# Patient Record
Sex: Male | Born: 1950 | Race: White | Hispanic: No | Smoking: Former smoker
Health system: Southern US, Community
[De-identification: ages and names within clinical notes are randomized; demographics above are authoritative.]

## PROBLEM LIST (undated history)

## (undated) DIAGNOSIS — I2510 Atherosclerotic heart disease of native coronary artery without angina pectoris: Secondary | ICD-10-CM

## (undated) DIAGNOSIS — I10 Essential (primary) hypertension: Secondary | ICD-10-CM

## (undated) DIAGNOSIS — R0600 Dyspnea, unspecified: Secondary | ICD-10-CM

## (undated) DIAGNOSIS — I6529 Occlusion and stenosis of unspecified carotid artery: Secondary | ICD-10-CM

## (undated) DIAGNOSIS — S42409A Unspecified fracture of lower end of unspecified humerus, initial encounter for closed fracture: Secondary | ICD-10-CM

## (undated) DIAGNOSIS — M1990 Unspecified osteoarthritis, unspecified site: Secondary | ICD-10-CM

## (undated) DIAGNOSIS — E7800 Pure hypercholesterolemia, unspecified: Secondary | ICD-10-CM

## (undated) DIAGNOSIS — M19042 Primary osteoarthritis, left hand: Secondary | ICD-10-CM

## (undated) DIAGNOSIS — I639 Cerebral infarction, unspecified: Secondary | ICD-10-CM

## (undated) DIAGNOSIS — M24521 Contracture, right elbow: Secondary | ICD-10-CM

## (undated) DIAGNOSIS — M5136 Other intervertebral disc degeneration, lumbar region: Secondary | ICD-10-CM

## (undated) DIAGNOSIS — M87052 Idiopathic aseptic necrosis of left femur: Secondary | ICD-10-CM

## (undated) DIAGNOSIS — M19041 Primary osteoarthritis, right hand: Secondary | ICD-10-CM

## (undated) DIAGNOSIS — M87051 Idiopathic aseptic necrosis of right femur: Secondary | ICD-10-CM

## (undated) HISTORY — DX: Unspecified fracture of lower end of unspecified humerus, initial encounter for closed fracture: S42.409A

## (undated) HISTORY — PX: EYE SURGERY: SHX253

## (undated) HISTORY — DX: Idiopathic aseptic necrosis of right femur: M87.051

## (undated) HISTORY — DX: Primary osteoarthritis, left hand: M19.042

## (undated) HISTORY — DX: Contracture, right elbow: M24.521

## (undated) HISTORY — DX: Other intervertebral disc degeneration, lumbar region: M51.36

## (undated) HISTORY — DX: Idiopathic aseptic necrosis of left femur: M87.052

## (undated) HISTORY — DX: Cerebral infarction, unspecified: I63.9

## (undated) HISTORY — DX: Dyspnea, unspecified: R06.00

## (undated) HISTORY — DX: Pure hypercholesterolemia, unspecified: E78.00

## (undated) HISTORY — DX: Occlusion and stenosis of unspecified carotid artery: I65.29

## (undated) HISTORY — DX: Atherosclerotic heart disease of native coronary artery without angina pectoris: I25.10

## (undated) HISTORY — DX: Unspecified osteoarthritis, unspecified site: M19.90

## (undated) HISTORY — DX: Primary osteoarthritis, right hand: M19.041

## (undated) HISTORY — DX: Essential (primary) hypertension: I10

---

## 2001-08-13 HISTORY — PX: HERNIA REPAIR: SHX51

## 2002-09-14 ENCOUNTER — Encounter: Payer: Self-pay | Admitting: Internal Medicine

## 2002-09-14 ENCOUNTER — Ambulatory Visit (HOSPITAL_COMMUNITY): Admission: RE | Admit: 2002-09-14 | Discharge: 2002-09-14 | Payer: Self-pay | Admitting: Internal Medicine

## 2002-09-28 ENCOUNTER — Encounter: Admission: RE | Admit: 2002-09-28 | Discharge: 2002-09-28 | Payer: Self-pay

## 2002-09-29 ENCOUNTER — Ambulatory Visit (HOSPITAL_BASED_OUTPATIENT_CLINIC_OR_DEPARTMENT_OTHER): Admission: RE | Admit: 2002-09-29 | Discharge: 2002-09-29 | Payer: Self-pay

## 2005-06-16 ENCOUNTER — Emergency Department (HOSPITAL_COMMUNITY): Admission: EM | Admit: 2005-06-16 | Discharge: 2005-06-16 | Payer: Self-pay | Admitting: Emergency Medicine

## 2005-06-19 ENCOUNTER — Encounter (HOSPITAL_COMMUNITY): Admission: RE | Admit: 2005-06-19 | Discharge: 2005-09-17 | Payer: Self-pay | Admitting: Emergency Medicine

## 2005-06-23 ENCOUNTER — Emergency Department (HOSPITAL_COMMUNITY): Admission: EM | Admit: 2005-06-23 | Discharge: 2005-06-23 | Payer: Self-pay | Admitting: Emergency Medicine

## 2005-07-16 ENCOUNTER — Emergency Department (HOSPITAL_COMMUNITY): Admission: EM | Admit: 2005-07-16 | Discharge: 2005-07-16 | Payer: Self-pay | Admitting: Family Medicine

## 2007-04-01 ENCOUNTER — Emergency Department (HOSPITAL_COMMUNITY): Admission: EM | Admit: 2007-04-01 | Discharge: 2007-04-01 | Payer: Self-pay | Admitting: Emergency Medicine

## 2008-03-31 ENCOUNTER — Encounter: Admission: RE | Admit: 2008-03-31 | Discharge: 2008-03-31 | Payer: Self-pay | Admitting: Internal Medicine

## 2008-04-02 ENCOUNTER — Encounter: Admission: RE | Admit: 2008-04-02 | Discharge: 2008-04-02 | Payer: Self-pay | Admitting: Neurosurgery

## 2010-05-10 ENCOUNTER — Inpatient Hospital Stay (HOSPITAL_COMMUNITY): Admission: AD | Admit: 2010-05-10 | Discharge: 2010-05-14 | Payer: Self-pay | Admitting: Psychiatry

## 2010-05-10 ENCOUNTER — Ambulatory Visit: Payer: Self-pay | Admitting: Psychiatry

## 2010-05-10 ENCOUNTER — Emergency Department (HOSPITAL_COMMUNITY): Admission: EM | Admit: 2010-05-10 | Discharge: 2010-05-10 | Payer: Self-pay | Admitting: Emergency Medicine

## 2010-10-26 LAB — RAPID URINE DRUG SCREEN, HOSP PERFORMED
Amphetamines: NOT DETECTED
Barbiturates: NOT DETECTED
Benzodiazepines: POSITIVE — AB
Opiates: POSITIVE — AB

## 2010-10-26 LAB — CBC
MCH: 31 pg (ref 26.0–34.0)
MCHC: 33.6 g/dL (ref 30.0–36.0)
MCV: 92.3 fL (ref 78.0–100.0)
Platelets: 294 10*3/uL (ref 150–400)
RBC: 4.81 MIL/uL (ref 4.22–5.81)
RDW: 13 % (ref 11.5–15.5)

## 2010-10-26 LAB — DIFFERENTIAL
Eosinophils Absolute: 0.4 10*3/uL (ref 0.0–0.7)
Eosinophils Relative: 4 % (ref 0–5)
Lymphs Abs: 3.1 10*3/uL (ref 0.7–4.0)

## 2010-10-26 LAB — BASIC METABOLIC PANEL
BUN: 8 mg/dL (ref 6–23)
CO2: 25 mEq/L (ref 19–32)
Chloride: 103 mEq/L (ref 96–112)
Creatinine, Ser: 0.85 mg/dL (ref 0.4–1.5)
Glucose, Bld: 122 mg/dL — ABNORMAL HIGH (ref 70–99)

## 2010-10-26 LAB — HEPATIC FUNCTION PANEL
Alkaline Phosphatase: 73 U/L (ref 39–117)
Indirect Bilirubin: 0.8 mg/dL (ref 0.3–0.9)
Total Protein: 7.8 g/dL (ref 6.0–8.3)

## 2010-12-29 NOTE — Op Note (Signed)
   NAME:  Connor Butler, Connor Butler                         ACCOUNT NO.:  [id number]   MEDICAL RECORD NO.:  [id number]                   PATIENT TYPE:  AMB   LOCATION:  DSC                                  FACILITY:  [facility]   PHYSICIAN:  Lorre Munroe., M.D.            DATE OF BIRTH:  1950/08/17   DATE OF PROCEDURE:  09/29/2002  DATE OF DISCHARGE:                                 OPERATIVE REPORT   PREOPERATIVE DIAGNOSIS:  Umbilical hernia.   POSTOPERATIVE DIAGNOSIS:  Umbilical hernia.   OPERATION PERFORMED:  Repair of umbilical hernia.   SURGEON:  Lebron Conners, M.D.   ANESTHESIA:  General.   DESCRIPTION OF PROCEDURE:  After the patient was monitored and anesthetized  and had routine preparation and draping of the abdomen, I made a transverse  incision over the hernia just above the umbilicus.  It was quite large and  required an incision about 7 to 8 cm in length.  I then dissected down  through the subcutaneous tissues and encountered the hernia sac and  separated it from the umbilical skin and surrounding normal fat of the  subcutaneous tissues.  I dissected it all the way down to the hole in the  midline fascia which was about 2 cm in size.  In the dissection, I made a  couple of holes in the hernia sac.  I decided to remove it so I completely  excised the hernia sac and took down adhesions of the incarcerated omentum  to the sac.  I then enlarged the incision a bit on each side in order to  reduce the omentum and it easily went back in.  I then closed the defect  with running 0 Prolene.  I then mobilized the subcutaneous tissues in all  directions and took a piece of polypropylene mesh which was approximately 7  x 5 cm in dimension and trimmed to be oval and decided to sew that down with  running 2-0 Prolene basting stitch.  I went all the way around and tied the  suture back to itself.  That appeared to provide a very secure hernia  repair.  I then closed the subcutaneous tissues  with running 4-0 Vicryl and  sutured the umbilical skin which remained after I debrided very dusky skin  down to the central part of the repair.  I then closed the skin with  staples.  I applied a slightly compressive bandage.  The patient tolerated  the procedure well.                                                Lorre Munroe., M.D.    WB/MEDQ  D:  09/29/2002  T:  09/29/2002  Job:  [id number]   cc:   [facility]

## 2013-11-19 ENCOUNTER — Other Ambulatory Visit: Payer: Self-pay | Admitting: Family Medicine

## 2013-11-19 DIAGNOSIS — R0989 Other specified symptoms and signs involving the circulatory and respiratory systems: Secondary | ICD-10-CM

## 2013-11-20 ENCOUNTER — Other Ambulatory Visit: Payer: Self-pay | Admitting: Family Medicine

## 2013-11-24 ENCOUNTER — Ambulatory Visit
Admission: RE | Admit: 2013-11-24 | Discharge: 2013-11-24 | Disposition: A | Discharge status: Home / Self Care | Payer: Medicare Other | Source: Ambulatory Visit | Attending: Family Medicine | Admitting: Family Medicine

## 2013-11-24 DIAGNOSIS — R0989 Other specified symptoms and signs involving the circulatory and respiratory systems: Secondary | ICD-10-CM

## 2013-11-27 ENCOUNTER — Encounter: Payer: Self-pay | Admitting: Vascular Surgery

## 2013-11-30 ENCOUNTER — Encounter: Payer: Self-pay | Admitting: Vascular Surgery

## 2013-12-01 ENCOUNTER — Encounter: Payer: Self-pay | Admitting: Vascular Surgery

## 2013-12-01 ENCOUNTER — Ambulatory Visit (INDEPENDENT_AMBULATORY_CARE_PROVIDER_SITE_OTHER): Payer: Medicare Other | Admitting: Vascular Surgery

## 2013-12-01 VITALS — BP 156/70 | HR 68 | Ht 66.0 in | Wt 181.0 lb

## 2013-12-01 DIAGNOSIS — I6529 Occlusion and stenosis of unspecified carotid artery: Secondary | ICD-10-CM

## 2013-12-01 NOTE — Progress Notes (Signed)
Patient name: Connor Butler MRN: [id number] DOB: [birth year] Sex: male   Referred by: Marisue Humble  Reason for referral:  Chief Complaint  Patient presents with  . New Evaluation    left carotid stenosis    HISTORY OF PRESENT ILLNESS: Patient is a very pleasant 63 year old gentleman who was found to have bilateral carotid bruits. He subsequently underwent carotid duplex and [facility] imaging and I have these results for review. This does show occlusion of the right internal carotid artery and high-grade stenosis of the right common carotid artery. There is estimated at 50-69% stenosis in the left internal carotid artery. The patient is right-handed. He specifically denies any prior episodes of amaurosis fugax, aphasia TIA or stroke. He denies any cardiac difficulty. A long history of cigarette smoking and also heavy alcohol consumption  Past Medical History  Diagnosis Date  . Hypertension   . Carotid artery occlusion   . Arthritis     Past Surgical History  Procedure Laterality Date  . Hernia repair  2003    History   Social History  . Marital Status: Legally Separated    Spouse Name: N/A    Number of Children: N/A  . Years of Education: N/A   Occupational History  . Not on file.   Social History Main Topics  . Smoking status: Current Every Day Smoker -- 1.00 packs/day for 45 years    Types: Cigarettes  . Smokeless tobacco: Not on file  . Alcohol Use: 25.2 oz/week    42 Cans of beer per week  . Drug Use: No  . Sexual Activity: Not on file   Other Topics Concern  . Not on file   Social History Narrative  . No narrative on file    History reviewed. No pertinent family history.  Allergies as of 12/01/2013  . (No Known Allergies)    Current Outpatient Prescriptions on File Prior to Visit  Medication Sig Dispense Refill  . lisinopril (PRINIVIL,ZESTRIL) 10 MG tablet Take 10 mg by mouth daily.       No current facility-administered medications on file prior  to visit.     REVIEW OF SYSTEMS:  Positives indicated with an "X"  CARDIOVASCULAR:  [ ]  chest pain   [ ]  chest pressure   [ ]  palpitations   [ ]  orthopnea   [x ] dyspnea on exertion   [ ]  claudication   [ ]  rest pain   [ ]  DVT   [ ]  phlebitis PULMONARY:   [x ] productive cough   [ ]  asthma   [ ]  wheezing NEUROLOGIC:   [ ]  weakness  [ ]  paresthesias  [ ]  aphasia  [ ]  amaurosis  [xx ] dizziness HEMATOLOGIC:   [ ]  bleeding problems   [ ]  clotting disorders MUSCULOSKELETAL:  [ ]  joint pain   [ ]  joint swelling GASTROINTESTINAL: [ ]   blood in stool  [ ]   hematemesis GENITOURINARY:  [ ]   dysuria  [ ]   hematuria PSYCHIATRIC:  [ ]  history of major depression INTEGUMENTARY:  [ ]  rashes  [ ]  ulcers CONSTITUTIONAL:  [ ]  fever   [ ]  chills  PHYSICAL EXAMINATION:  General: The patient is a well-nourished male, in no acute distress. Vital signs are BP 156/70  Pulse 68  Ht 5\' 6"  (1.676 m)  Wt 181 lb (82.101 kg)  BMI 29.23 kg/m2  SpO2 100% Pulmonary: There is a good air exchange bilaterally without wheezing or rales. Abdomen: Soft and non-tender  with normal pitch bowel sounds. Musculoskeletal: There are no major deformities.  There is no significant extremity pain. Neurologic: No focal weakness or paresthesias are detected, Skin: There are no ulcer or rashes noted. Psychiatric: The patient has normal affect. Cardiovascular: There is a regular rate and rhythm without significant murmur appreciated.    Vascular Lab Studies:  Reviewed from [facility] imaging with findings as above  Impression and Plan:  Asymptomatic right internal carotid artery occlusion and moderate to severe left internal carotid artery stenosis. Had a long discussion with the patient explaining the significance of this. I explained symptoms of carotid disease and he will notify us immediate should this occur. Otherwise we'll see him again at 6 month intervals with carotid duplex evaluation. I did discuss the critical  importance of smoking cessation and this was directly related to his severe extracranial cerebrovascular occlusive disease. He will see [place] in 6 months    Arvilla Meres [facility] Office: [phone]

## 2013-12-01 NOTE — Addendum Note (Signed)
Addended by: Dorthula Rue L on: 12/01/2013 11:10 AM   Modules accepted: Orders

## 2014-06-01 ENCOUNTER — Ambulatory Visit: Payer: Medicare Other | Admitting: Vascular Surgery

## 2014-06-01 ENCOUNTER — Other Ambulatory Visit (HOSPITAL_COMMUNITY): Payer: Medicare Other

## 2014-06-22 ENCOUNTER — Ambulatory Visit: Payer: Medicare Other | Admitting: Vascular Surgery

## 2014-06-22 ENCOUNTER — Other Ambulatory Visit (HOSPITAL_COMMUNITY): Payer: Medicare Other

## 2014-07-16 ENCOUNTER — Ambulatory Visit (HOSPITAL_COMMUNITY)
Admission: RE | Admit: 2014-07-16 | Discharge: 2014-07-16 | Disposition: A | Discharge status: Home / Self Care | Payer: Medicare Other | Source: Ambulatory Visit | Attending: Vascular Surgery | Admitting: Vascular Surgery

## 2014-07-16 DIAGNOSIS — I6523 Occlusion and stenosis of bilateral carotid arteries: Secondary | ICD-10-CM

## 2014-07-19 ENCOUNTER — Encounter: Payer: Self-pay | Admitting: Vascular Surgery

## 2014-07-20 ENCOUNTER — Encounter: Payer: Self-pay | Admitting: Vascular Surgery

## 2014-07-20 ENCOUNTER — Other Ambulatory Visit: Payer: Self-pay | Admitting: Vascular Surgery

## 2014-07-20 ENCOUNTER — Ambulatory Visit (INDEPENDENT_AMBULATORY_CARE_PROVIDER_SITE_OTHER): Payer: Medicare Other | Admitting: Vascular Surgery

## 2014-07-20 VITALS — BP 135/83 | HR 76 | Resp 16 | Ht 66.0 in | Wt 181.0 lb

## 2014-07-20 DIAGNOSIS — R0989 Other specified symptoms and signs involving the circulatory and respiratory systems: Secondary | ICD-10-CM

## 2014-07-20 DIAGNOSIS — Z01810 Encounter for preprocedural cardiovascular examination: Secondary | ICD-10-CM

## 2014-07-20 DIAGNOSIS — I6523 Occlusion and stenosis of bilateral carotid arteries: Secondary | ICD-10-CM

## 2014-07-20 LAB — CREATININE, SERUM: CREATININE: 1.3 mg/dL (ref 0.50–1.35)

## 2014-07-20 LAB — BUN: BUN: 13 mg/dL (ref 6–23)

## 2014-07-20 NOTE — Progress Notes (Signed)
Here today for continued follow-up of his extracranial sure vascular occlusive disease. I had seen him 6 months ago. At that time he had a carotid duplex for an outlying lab to evaluate carotid bruits. This revealed apparent occlusion of his right internal carotid artery with a common carotid artery stenosis on the right. There was moderate to severe left carotid stenosis. He was completely asymptomatic. He is seen today for 6 month follow-up. Fortunately continues to remain asymptomatic with no history of amaurosis fugax, transient ischemic attack or stroke.  Past Medical History  Diagnosis Date  . Hypertension   . Carotid artery occlusion   . Arthritis     History  Substance Use Topics  . Smoking status: Current Every Day Smoker -- 1.00 packs/day for 45 years    Types: Cigarettes  . Smokeless tobacco: Never Used  . Alcohol Use: 25.2 oz/week    42 Cans of beer per week    History reviewed. No pertinent family history.  No Known Allergies  Current outpatient prescriptions: lisinopril (PRINIVIL,ZESTRIL) 10 MG tablet, Take 10 mg by mouth daily., Disp: , Rfl:   BP 135/83 mmHg  Pulse 76  Resp 16  Ht 5\' 6"  (1.676 m)  Wt 181 lb (82.101 kg)  BMI 29.23 kg/m2  Body mass index is 29.23 kg/(m^2).       Physical exam he is a well-developed gentleman in no acute distress neurologically intact Carotid arteries reveal harsh carotid bruits bilaterally 2+ radial pulses bilaterally No focal neurologic deficits Respirations are equal nonlabored  He did undergo repeat duplex in our office today. This doesn't did suggest string sign with some flow into the right internal carotid artery on duplex. Did show 60-70% left internal carotid artery stenosis  Impression and plan extracranial cerebrovascular occlusive disease. Prior study had suggested right internal artery occlusion whereas today's study suggesting this may be high-grade stenosis. I think this is somewhat difficult to determine on  duplex. I have recommended CT angiogram for further evaluation. I explained that this did show a high-grade stenosis with a normal internal carotid artery above we would recommend endarterectomy. We will discuss this with him following CT angiogram for further evaluation of his carotid stenosis

## 2014-07-20 NOTE — Addendum Note (Signed)
Addended by: Mena Goes on: 07/20/2014 04:47 PM   Modules accepted: Orders

## 2014-07-21 NOTE — Addendum Note (Signed)
Addended by: Mena Goes on: 07/21/2014 01:39 PM   Modules accepted: Orders

## 2014-07-22 ENCOUNTER — Ambulatory Visit
Admission: RE | Admit: 2014-07-22 | Discharge: 2014-07-22 | Disposition: A | Discharge status: Home / Self Care | Payer: Medicare Other | Source: Ambulatory Visit | Attending: Vascular Surgery | Admitting: Vascular Surgery

## 2014-07-22 DIAGNOSIS — R0989 Other specified symptoms and signs involving the circulatory and respiratory systems: Secondary | ICD-10-CM

## 2014-07-22 DIAGNOSIS — Z01810 Encounter for preprocedural cardiovascular examination: Secondary | ICD-10-CM

## 2014-07-22 DIAGNOSIS — I6523 Occlusion and stenosis of bilateral carotid arteries: Secondary | ICD-10-CM

## 2014-07-22 MED ORDER — IOHEXOL 350 MG/ML SOLN
65.0000 mL | Freq: Once | INTRAVENOUS | Status: AC | PRN
  Administered 2014-07-22: 65 mL via INTRAVENOUS

## 2014-08-03 ENCOUNTER — Ambulatory Visit: Payer: Medicare Other | Admitting: Vascular Surgery

## 2014-08-04 ENCOUNTER — Telehealth: Payer: Self-pay | Admitting: Vascular Surgery

## 2014-08-04 NOTE — Telephone Encounter (Signed)
-----   Message from Sherrye Payor, RN sent at 08/03/2014  5:15 PM EST ----- Regarding: RE: needs 1 yr. f/u Contact: [phone] I rechecked with Dr. Donnetta Hutching.  He felt it would be okay to do this at 1 yr., since his left ICA Stenosis is at 55 %.  Thank you!  ----- Message -----    From: Gena Fray    Sent: 08/03/2014   3:27 PM      To: Lynetta Mare Pullins, RN Subject: RE: needs 1 yr. f/u                            Just to clarify- I need to cancel his appointment in June 2016 for carotid ultrasound and OV and reschedule out to 07/2015. Is that correct?  Hinton Dyer ----- Message -----    From: Lynetta Mare Pullins, RN    Sent: 08/03/2014   3:08 PM      To: [facility] Rose Admin Pool Subject: needs 1 yr. f/u                                Per recommendation of Dr. Donnetta Hutching, this pt. needs f/u in one year for bilateral carotid ultrasound and OV w/ Dr. Donnetta Hutching; recent CTA of neck  showed near complete occlusion of right ICA, and 55% prox. stenosis of Left ICA; pt. was notified by phone. I told him he would be notified of one year appt.

## 2014-10-12 DIAGNOSIS — E780 Pure hypercholesterolemia: Secondary | ICD-10-CM | POA: Diagnosis not present

## 2014-12-31 DIAGNOSIS — I10 Essential (primary) hypertension: Secondary | ICD-10-CM | POA: Diagnosis not present

## 2014-12-31 DIAGNOSIS — I779 Disorder of arteries and arterioles, unspecified: Secondary | ICD-10-CM | POA: Diagnosis not present

## 2014-12-31 DIAGNOSIS — H539 Unspecified visual disturbance: Secondary | ICD-10-CM | POA: Diagnosis not present

## 2014-12-31 DIAGNOSIS — Z720 Tobacco use: Secondary | ICD-10-CM | POA: Diagnosis not present

## 2014-12-31 DIAGNOSIS — E780 Pure hypercholesterolemia: Secondary | ICD-10-CM | POA: Diagnosis not present

## 2014-12-31 DIAGNOSIS — M791 Myalgia: Secondary | ICD-10-CM | POA: Diagnosis not present

## 2015-01-25 ENCOUNTER — Other Ambulatory Visit (HOSPITAL_COMMUNITY): Payer: Medicare Other

## 2015-01-25 ENCOUNTER — Ambulatory Visit: Payer: Medicare Other | Admitting: Vascular Surgery

## 2015-02-02 DIAGNOSIS — H02834 Dermatochalasis of left upper eyelid: Secondary | ICD-10-CM | POA: Diagnosis not present

## 2015-02-02 DIAGNOSIS — R6889 Other general symptoms and signs: Secondary | ICD-10-CM | POA: Diagnosis not present

## 2015-02-02 DIAGNOSIS — H02831 Dermatochalasis of right upper eyelid: Secondary | ICD-10-CM | POA: Diagnosis not present

## 2015-02-02 DIAGNOSIS — H25813 Combined forms of age-related cataract, bilateral: Secondary | ICD-10-CM | POA: Diagnosis not present

## 2015-02-24 DIAGNOSIS — H268 Other specified cataract: Secondary | ICD-10-CM | POA: Diagnosis not present

## 2015-02-24 DIAGNOSIS — R6889 Other general symptoms and signs: Secondary | ICD-10-CM | POA: Diagnosis not present

## 2015-02-24 DIAGNOSIS — H5703 Miosis: Secondary | ICD-10-CM | POA: Diagnosis not present

## 2015-02-24 DIAGNOSIS — H2511 Age-related nuclear cataract, right eye: Secondary | ICD-10-CM | POA: Diagnosis not present

## 2015-03-21 DIAGNOSIS — H2512 Age-related nuclear cataract, left eye: Secondary | ICD-10-CM | POA: Diagnosis not present

## 2015-03-24 DIAGNOSIS — H2512 Age-related nuclear cataract, left eye: Secondary | ICD-10-CM | POA: Diagnosis not present

## 2015-06-01 ENCOUNTER — Inpatient Hospital Stay (HOSPITAL_COMMUNITY)
Admission: EM | Admit: 2015-06-01 | Discharge: 2015-06-03 | DRG: 065 | Disposition: A | Discharge status: Home / Self Care | Payer: Medicare HMO | Attending: Internal Medicine | Admitting: Internal Medicine

## 2015-06-01 ENCOUNTER — Encounter (HOSPITAL_COMMUNITY): Payer: Self-pay | Admitting: Emergency Medicine

## 2015-06-01 ENCOUNTER — Emergency Department (HOSPITAL_COMMUNITY): Payer: Medicare HMO

## 2015-06-01 ENCOUNTER — Inpatient Hospital Stay (HOSPITAL_COMMUNITY): Payer: Medicare HMO

## 2015-06-01 DIAGNOSIS — I6523 Occlusion and stenosis of bilateral carotid arteries: Secondary | ICD-10-CM | POA: Diagnosis not present

## 2015-06-01 DIAGNOSIS — R51 Headache: Secondary | ICD-10-CM | POA: Diagnosis not present

## 2015-06-01 DIAGNOSIS — E871 Hypo-osmolality and hyponatremia: Secondary | ICD-10-CM | POA: Diagnosis not present

## 2015-06-01 DIAGNOSIS — F17210 Nicotine dependence, cigarettes, uncomplicated: Secondary | ICD-10-CM | POA: Diagnosis present

## 2015-06-01 DIAGNOSIS — M1990 Unspecified osteoarthritis, unspecified site: Secondary | ICD-10-CM | POA: Diagnosis not present

## 2015-06-01 DIAGNOSIS — F1029 Alcohol dependence with unspecified alcohol-induced disorder: Secondary | ICD-10-CM | POA: Diagnosis not present

## 2015-06-01 DIAGNOSIS — I6522 Occlusion and stenosis of left carotid artery: Secondary | ICD-10-CM | POA: Diagnosis not present

## 2015-06-01 DIAGNOSIS — F1210 Cannabis abuse, uncomplicated: Secondary | ICD-10-CM | POA: Diagnosis not present

## 2015-06-01 DIAGNOSIS — I639 Cerebral infarction, unspecified: Principal | ICD-10-CM

## 2015-06-01 DIAGNOSIS — I6789 Other cerebrovascular disease: Secondary | ICD-10-CM | POA: Diagnosis not present

## 2015-06-01 DIAGNOSIS — R42 Dizziness and giddiness: Secondary | ICD-10-CM | POA: Diagnosis not present

## 2015-06-01 DIAGNOSIS — J449 Chronic obstructive pulmonary disease, unspecified: Secondary | ICD-10-CM | POA: Diagnosis not present

## 2015-06-01 DIAGNOSIS — J019 Acute sinusitis, unspecified: Secondary | ICD-10-CM | POA: Diagnosis present

## 2015-06-01 DIAGNOSIS — Z720 Tobacco use: Secondary | ICD-10-CM

## 2015-06-01 DIAGNOSIS — I6503 Occlusion and stenosis of bilateral vertebral arteries: Secondary | ICD-10-CM | POA: Diagnosis present

## 2015-06-01 DIAGNOSIS — I10 Essential (primary) hypertension: Secondary | ICD-10-CM | POA: Diagnosis present

## 2015-06-01 DIAGNOSIS — F1020 Alcohol dependence, uncomplicated: Secondary | ICD-10-CM | POA: Diagnosis not present

## 2015-06-01 DIAGNOSIS — R531 Weakness: Secondary | ICD-10-CM | POA: Diagnosis not present

## 2015-06-01 DIAGNOSIS — R200 Anesthesia of skin: Secondary | ICD-10-CM | POA: Diagnosis not present

## 2015-06-01 DIAGNOSIS — Z7982 Long term (current) use of aspirin: Secondary | ICD-10-CM | POA: Diagnosis not present

## 2015-06-01 LAB — CBC
HCT: 39.4 % (ref 39.0–52.0)
HEMATOCRIT: 36.5 % — AB (ref 39.0–52.0)
Hemoglobin: 13.3 g/dL (ref 13.0–17.0)
Hemoglobin: 12 g/dL — ABNORMAL LOW (ref 13.0–17.0)
MCH: 30.7 pg (ref 26.0–34.0)
MCH: 30.1 pg (ref 26.0–34.0)
MCHC: 33.8 g/dL (ref 30.0–36.0)
MCHC: 32.9 g/dL (ref 30.0–36.0)
MCV: 91 fL (ref 78.0–100.0)
MCV: 91.5 fL (ref 78.0–100.0)
PLATELETS: 316 10*3/uL (ref 150–400)
Platelets: 284 10*3/uL (ref 150–400)
RBC: 4.33 MIL/uL (ref 4.22–5.81)
RBC: 3.99 MIL/uL — AB (ref 4.22–5.81)
RDW: 12.5 % (ref 11.5–15.5)
RDW: 12.6 % (ref 11.5–15.5)
WBC: 8.3 10*3/uL (ref 4.0–10.5)
WBC: 7.4 10*3/uL (ref 4.0–10.5)

## 2015-06-01 LAB — RAPID URINE DRUG SCREEN, HOSP PERFORMED
Amphetamines: NOT DETECTED
BARBITURATES: NOT DETECTED
Benzodiazepines: NOT DETECTED
Cocaine: NOT DETECTED
OPIATES: NOT DETECTED
TETRAHYDROCANNABINOL: POSITIVE — AB

## 2015-06-01 LAB — URINALYSIS, ROUTINE W REFLEX MICROSCOPIC
BILIRUBIN URINE: NEGATIVE
Glucose, UA: NEGATIVE mg/dL
HGB URINE DIPSTICK: NEGATIVE
KETONES UR: NEGATIVE mg/dL
Leukocytes, UA: NEGATIVE
Nitrite: NEGATIVE
Protein, ur: NEGATIVE mg/dL
SPECIFIC GRAVITY, URINE: 1.008 (ref 1.005–1.030)
UROBILINOGEN UA: 0.2 mg/dL (ref 0.0–1.0)
pH: 5.5 (ref 5.0–8.0)

## 2015-06-01 LAB — PHOSPHORUS: PHOSPHORUS: 2.6 mg/dL (ref 2.5–4.6)

## 2015-06-01 LAB — PROTIME-INR
INR: 1 (ref 0.00–1.49)
PROTHROMBIN TIME: 13.4 s (ref 11.6–15.2)

## 2015-06-01 LAB — ETHANOL

## 2015-06-01 LAB — COMPREHENSIVE METABOLIC PANEL
ALBUMIN: 3.8 g/dL (ref 3.5–5.0)
ALT: 17 U/L (ref 17–63)
ANION GAP: 10 (ref 5–15)
AST: 23 U/L (ref 15–41)
Alkaline Phosphatase: 54 U/L (ref 38–126)
BILIRUBIN TOTAL: 0.5 mg/dL (ref 0.3–1.2)
BUN: 9 mg/dL (ref 6–20)
CALCIUM: 9.3 mg/dL (ref 8.9–10.3)
CHLORIDE: 98 mmol/L — AB (ref 101–111)
CO2: 23 mmol/L (ref 22–32)
CREATININE: 1.1 mg/dL (ref 0.61–1.24)
GFR calc Af Amer: >60 mL/min (ref >60)
GFR calc non Af Amer: >60 mL/min (ref >60)
GLUCOSE: 108 mg/dL — AB (ref 65–99)
POTASSIUM: 4.1 mmol/L (ref 3.5–5.1)
SODIUM: 131 mmol/L — AB (ref 135–145)
TOTAL PROTEIN: 7.1 g/dL (ref 6.5–8.1)

## 2015-06-01 LAB — DIFFERENTIAL
BASOS ABS: 0.1 10*3/uL (ref 0.0–0.1)
BASOS PCT: 1 %
EOS ABS: 0.2 10*3/uL (ref 0.0–0.7)
EOS PCT: 3 %
Lymphocytes Relative: 22 %
Lymphs Abs: 1.9 10*3/uL (ref 0.7–4.0)
Monocytes Absolute: 1.2 10*3/uL — ABNORMAL HIGH (ref 0.1–1.0)
Monocytes Relative: 15 %
NEUTROS PCT: 59 %
Neutro Abs: 4.9 10*3/uL (ref 1.7–7.7)

## 2015-06-01 LAB — APTT: aPTT: 28 seconds (ref 24–37)

## 2015-06-01 LAB — CREATININE, SERUM: Creatinine, Ser: 1.06 mg/dL (ref 0.61–1.24)

## 2015-06-01 LAB — MAGNESIUM: Magnesium: 2 mg/dL (ref 1.7–2.4)

## 2015-06-01 LAB — I-STAT TROPONIN, ED: Troponin i, poc: 0 ng/mL (ref 0.00–0.08)

## 2015-06-01 LAB — TROPONIN I

## 2015-06-01 MED ORDER — LORAZEPAM 1 MG PO TABS
0.0000 mg | ORAL_TABLET | Freq: Four times a day (QID) | ORAL | Status: AC
Start: 2015-06-01 — End: 2015-06-03

## 2015-06-01 MED ORDER — VITAMIN B-1 100 MG PO TABS
100.0000 mg | ORAL_TABLET | Freq: Every day | ORAL | Status: DC
  Administered 2015-06-01 – 2015-06-03 (×3): 100 mg via ORAL
  Filled 2015-06-01 (×3): qty 1

## 2015-06-01 MED ORDER — SODIUM CHLORIDE 0.9 % IJ SOLN
3.0000 mL | Freq: Two times a day (BID) | INTRAMUSCULAR | Status: DC
  Administered 2015-06-01 – 2015-06-02 (×3): 3 mL via INTRAVENOUS

## 2015-06-01 MED ORDER — MAGNESIUM SULFATE 2 GM/50ML IV SOLN
2.0000 g | Freq: Once | INTRAVENOUS | Status: AC
  Administered 2015-06-01: 2 g via INTRAVENOUS
  Filled 2015-06-01 (×2): qty 50

## 2015-06-01 MED ORDER — LISINOPRIL 10 MG PO TABS
10.0000 mg | ORAL_TABLET | Freq: Every day | ORAL | Status: DC
  Filled 2015-06-01: qty 1

## 2015-06-01 MED ORDER — STROKE: EARLY STAGES OF RECOVERY BOOK
Freq: Once | Status: AC
  Administered 2015-06-01: 22:00:00

## 2015-06-01 MED ORDER — SENNOSIDES-DOCUSATE SODIUM 8.6-50 MG PO TABS: 1.0000 | ORAL_TABLET | Freq: Every evening | ORAL | Status: DC | PRN

## 2015-06-01 MED ORDER — THIAMINE HCL 100 MG/ML IJ SOLN: 100.0000 mg | Freq: Every day | INTRAMUSCULAR | Status: DC

## 2015-06-01 MED ORDER — NICOTINE 21 MG/24HR TD PT24
21.0000 mg | MEDICATED_PATCH | Freq: Every day | TRANSDERMAL | Status: DC
  Administered 2015-06-01 – 2015-06-03 (×3): 21 mg via TRANSDERMAL
  Filled 2015-06-01 (×3): qty 1

## 2015-06-01 MED ORDER — ENOXAPARIN SODIUM 40 MG/0.4ML ~~LOC~~ SOLN
40.0000 mg | SUBCUTANEOUS | Status: DC
  Administered 2015-06-01 – 2015-06-02 (×2): 40 mg via SUBCUTANEOUS
  Filled 2015-06-01 (×2): qty 0.4

## 2015-06-01 MED ORDER — LORAZEPAM 2 MG/ML IJ SOLN
0.0000 mg | Freq: Two times a day (BID) | INTRAMUSCULAR | Status: AC
  Filled 2015-06-01: qty 1

## 2015-06-01 MED ORDER — ASPIRIN 325 MG PO TABS
325.0000 mg | ORAL_TABLET | Freq: Once | ORAL | Status: AC
Start: 2015-06-01 — End: 2015-06-01
  Administered 2015-06-01: 325 mg via ORAL
  Filled 2015-06-01: qty 1

## 2015-06-01 MED ORDER — GADOBENATE DIMEGLUMINE 529 MG/ML IV SOLN
17.0000 mL | Freq: Once | INTRAVENOUS | Status: AC | PRN
  Administered 2015-06-01: 17 mL via INTRAVENOUS

## 2015-06-01 MED ORDER — LORAZEPAM 1 MG PO TABS
0.0000 mg | ORAL_TABLET | Freq: Two times a day (BID) | ORAL | Status: DC
Start: 2015-06-01 — End: 2015-06-03

## 2015-06-01 MED ORDER — LORAZEPAM 2 MG/ML IJ SOLN
1.0000 mg | Freq: Once | INTRAMUSCULAR | Status: AC
  Administered 2015-06-01: 1 mg via INTRAVENOUS

## 2015-06-01 MED ORDER — LORAZEPAM 2 MG/ML IJ SOLN: 0.0000 mg | Freq: Four times a day (QID) | INTRAMUSCULAR | Status: AC

## 2015-06-01 MED ORDER — ASPIRIN EC 81 MG PO TBEC
81.0000 mg | DELAYED_RELEASE_TABLET | Freq: Every day | ORAL | Status: DC
  Administered 2015-06-02 – 2015-06-03 (×2): 81 mg via ORAL
  Filled 2015-06-01 (×2): qty 1

## 2015-06-01 NOTE — ED Provider Notes (Signed)
CSN: [id number]     Arrival date & time 06/01/15  1208 History   First MD Initiated Contact with Patient 06/01/15 1214     Chief Complaint  Patient presents with  . Weakness     (Consider location/radiation/quality/duration/timing/severity/associated sxs/prior Treatment) Patient is a 64 y.o. Butler presenting with weakness and extremity weakness.  Weakness Associated symptoms include headaches. Pertinent negatives include no chest pain, no abdominal pain and no shortness of breath.  Extremity Weakness This is a new problem. The current episode started more than 2 days ago. The problem occurs constantly. The problem has been gradually worsening. Associated symptoms include headaches. Pertinent negatives include no chest pain, no abdominal pain and no shortness of breath. Nothing aggravates the symptoms. Nothing relieves the symptoms. He has tried nothing for the symptoms.    Past Medical History  Diagnosis Date  . Hypertension   . Carotid artery occlusion   . Arthritis    Past Surgical History  Procedure Laterality Date  . Hernia repair  2003   History reviewed. No pertinent family history. Social History  Substance Use Topics  . Smoking status: Current Every Day Smoker -- 1.00 packs/day for 45 years    Types: Cigarettes  . Smokeless tobacco: Never Used  . Alcohol Use: 25.2 oz/week    42 Cans of beer per week    Review of Systems  Respiratory: Negative for cough and shortness of breath.   Cardiovascular: Negative for chest pain.  Gastrointestinal: Negative for abdominal pain.  Musculoskeletal: Positive for extremity weakness.  Neurological: Positive for weakness and headaches.  All other systems reviewed and are negative.     Allergies  Review of patient's allergies indicates no known allergies.  Home Medications   Prior to Admission medications   Medication Sig Start Date End Date Taking? Authorizing Provider  lisinopril (PRINIVIL,ZESTRIL) 10 MG tablet Take 10 mg  by mouth daily.    Historical Provider, MD   BP 169/72 mmHg  Pulse 88  Resp 16  Ht 5\' 9"  (1.753 m)  Wt 181 lb (82.101 kg)  BMI 26.72 kg/m2  SpO2 99% Physical Exam  Constitutional: He is oriented to person, place, and time. He appears well-developed and well-nourished.  HENT:  Head: Normocephalic and atraumatic.  Eyes: Conjunctivae and EOM are normal.  Neck: Normal range of motion. Neck supple. Carotid bruit is present (bilateral).  Cardiovascular: Normal rate and regular rhythm.   Pulmonary/Chest: Effort normal. No respiratory distress.  Abdominal: Soft. There is no tenderness.  Musculoskeletal: Normal range of motion. He exhibits no edema or tenderness.  Neurological: He is alert and oriented to person, place, and time.  No altered mental status, able to give full seemingly accurate history.  Face is symmetric, EOM's intact, pupils equal and reactive, vision intact, tongue and uvula midline without deviation Upper and Lower extremity motor 5/5, slightly decreased grip strength on the right, intact pain perception in distal extremities, 2+ reflexes in biceps, patella and achilles tendons. Finger to nose normal, heel to shin normal.   Skin: Skin is warm and dry.  Nursing note and vitals reviewed.   ED Course  Procedures (including critical care time) Labs Review Labs Reviewed - No data to display  Imaging Review No results found. I have personally reviewed and evaluated these images and lab results as part of my medical decision-making.   EKG Interpretation None      MDM   Final diagnoses:  None   1 week of worsening right arm and hand weakness  to the point where he can prolong the coffee cup. Slight headache during that time and other symptoms. No history on exam right grip strength but otherwise neuro intact as documented above. CT with subacute versus chronic infarcts. Discussed case with neurology neurology would like to get a MRI and if there is an acute stroke then  admission if no acute stroke then patient will be discharged and follow up as an outpatient.  Care to Dr. Regenia Skeeter at approxiamtely 1700, please see his note for final disposition and management.    Merrily Pew, MD 06/02/15 [phone]

## 2015-06-01 NOTE — ED Notes (Signed)
Report attempted x2

## 2015-06-01 NOTE — H&P (Signed)
[facility] History and Physical  JERIS ROSER SAY:[id number] DOB: February 09, 1951 DOA: 06/01/2015  Referring physician: Sherwood Gambler, M.D. PCP: Simona Huh, MD   Chief Complaint: Right hand weakness.  HPI: Connor Butler is a 64 y.o. male with a past medical history of alcohol abuse, 2 pack per day cigarette smoker, hypertension, bilateral carotid artery disease (per patient right side is occluded and left side is nearly occluded) who is coming with a week history of right sided extremities weakness. Per patient, he noticed about a week ago that he was having some difficulty with his handwriting and handling things. This continued to happen in a very slow wave, but this morning the patient had significant difficulty picking up a couple coffee and also noticed that his right lower extremity was clumsy. He has had a mild headache, but denies dizziness, vision changes, dysphagia, speech or language abnormalities.  Workup in the ER showed a negative CT scan of the brain, but follow-up MRI showed multiple areas of acute and subacute infarctions in both cerebral hemispheres cortically and so cortically in the watershed pattern, mostly on the right. When seen in the ER the patient was in no acute distress.   Review of Systems:  Constitutional:  Positive fatigue. No weight loss, night sweats, Fevers, chills,   HEENT:  Positive mild headache  Denies difficulty swallowing,Tooth/dental problems,Sore throat,  No sneezing, itching, ear ache, nasal congestion, post nasal drip,  Cardio-vascular:  No chest pain, Orthopnea, PND, swelling in lower extremities, anasarca, dizziness, palpitations  GI:  No heartburn, indigestion, abdominal pain, nausea, vomiting, diarrhea, change in bowel habits, loss of appetite  Resp:  Frequent shortness of breath with exertion or at rest.  Frequent productive cough, denies hemoptysis. No chest wall deformity  Skin:  no rash or lesions.  GU:  no dysuria,  change in color of urine, no urgency or frequency. No flank pain.  Musculoskeletal:  No joint pain or swelling. No decreased range of motion. No back pain.  Psych:  No change in mood or affect. No depression or anxiety. No memory loss.  Neuro : As above mentioned.  Past Medical History  Diagnosis Date  . Hypertension   . Carotid artery occlusion   . Arthritis    Past Surgical History  Procedure Laterality Date  . Hernia repair  2003   Social History:  reports that he has been smoking Cigarettes.  He has a 45 pack-year smoking history. He has never used smokeless tobacco. He reports that he drinks about 25.2 oz of alcohol per week. He reports that he does not use illicit drugs.  No Known Allergies  History reviewed.  Father had CVA.   Prior to Admission medications   Medication Sig Start Date End Date Taking? Authorizing Provider  lisinopril (PRINIVIL,ZESTRIL) 10 MG tablet Take 10 mg by mouth daily.   Yes Historical Provider, MD  Naproxen Sodium (ALEVE) 220 MG CAPS Take 440 mg by mouth daily as needed (general pain).   Yes Historical Provider, MD   Physical Exam: Filed Vitals:   06/01/15 1755 06/01/15 1840 06/01/15 1845 06/01/15 1900  BP:  157/84 153/83 154/93  Pulse: 78 74 72 75  TempSrc:      Resp: 18  19 14   Height:      Weight:      SpO2: 100%  100% 99%    Wt Readings from Last 3 Encounters:  06/01/15 82.101 kg (181 lb)  07/20/14 82.101 kg (181 lb)  12/01/13 82.101 kg (181  lb)    General:  Appears calm and comfortable Eyes: PERRL, normal lids, irises & conjunctiva ENT: grossly normal hearing, lips & tongue Neck: no LAD, masses or thyromegaly Cardiovascular: RRR, no m/r/g. No LE edema. Telemetry: SR, no arrhythmias  Respiratory: CTA bilaterally, no w/r/r. Normal respiratory effort. Abdomen: soft, ntnd Skin: no rash or induration seen on limited exam Musculoskeletal: grossly normal tone BUE/BLE Psychiatric: grossly normal mood and affect, speech fluent and  appropriate Neurologic:  awake alert oriented 3, mild right pronator drift, no cranial nerves, sensory deficits on gross neurological exam. Coordination seems to be intact.           Labs on Admission:  Basic Metabolic Panel:  Recent Labs Lab 06/01/15 1243  NA 131*  K 4.1  CL 98*  CO2 23  GLUCOSE 108*  BUN 9  CREATININE 1.10  CALCIUM 9.3   Liver Function Tests:  Recent Labs Lab 06/01/15 1243  AST 23  ALT 17  ALKPHOS 54  BILITOT 0.5  PROT 7.1  ALBUMIN 3.8   CBC:  Recent Labs Lab 06/01/15 1243  WBC 8.3  NEUTROABS 4.9  HGB 13.3  HCT 39.4  MCV 91.0  PLT 316    Radiological Exams on Admission: Ct Head Wo Contrast  06/01/2015  CLINICAL DATA:  Right arm weakness for 1 week. Decreased grip strength. Dizziness. Possible stroke. EXAM: CT HEAD WITHOUT CONTRAST TECHNIQUE: Contiguous axial images were obtained from the base of the skull through the vertex without intravenous contrast. COMPARISON:  None. FINDINGS: There is no evidence of acute large territory infarct, intracranial hemorrhage, mass, midline shift, or extra-axial fluid collection. Mild generalized cerebral atrophy is within normal limits for age. There is a subcentimeter subacute to chronic cortical infarct in the right precentral gyrus. There is a subcentimeter subacute to chronic subcortical white matter infarct in the high left frontal lobe. Periventricular white-matter hypodensities are nonspecific but compatible with mild chronic small vessel ischemic disease. Prior bilateral cataract extraction is noted. There is evidence of chronic left maxillary sinusitis, currently with minimal mucosal thickening in a small amount of secretions present. Trace fluid is present in the right sphenoid sinus, and there is moderate left frontal sinus and mild bilateral ethmoid air cell mucosal thickening. Mastoid air cells are clear. Calcified atherosclerosis is noted at the skull base. IMPRESSION: 1. No evidence of large  territory infarct or intracranial hemorrhage. No mass effect. 2. Mild chronic small vessel ischemic disease in the cerebral white matter. 3. Subcentimeter, subacute to chronic infarcts in the high frontal lobes. Electronically Signed   By: Logan Bores M.D.   On: 06/01/2015 14:20   Mr Jodene Nam Head Wo Contrast  06/01/2015  CLINICAL DATA:  Progressive weakness in the right arm over the last week. Unable but PEG up coffee this morning. Antrum and headache. Hypertension. Carotid bruits. EXAM: MRI HEAD WITHOUT AND WITH CONTRAST MRA HEAD WITHOUT CONTRAST MRA NECK WITHOUT AND WITH CONTRAST TECHNIQUE: Multiplanar, multiecho pulse sequences of the brain and surrounding structures were obtained without and with intravenous contrast. Angiographic images of the Circle of Willis were obtained using MRA technique without intravenous contrast. Angiographic images of the neck were obtained using MRA technique without and with intravenous contrast. Carotid stenosis measurements (when applicable) are obtained utilizing NASCET criteria, using the distal internal carotid diameter as the denominator. CONTRAST:  32mL MULTIHANCE GADOBENATE DIMEGLUMINE 529 MG/ML IV SOLN COMPARISON:  CT head from the same day. FINDINGS: MRI HEAD FINDINGS The diffusion-weighted images confirm scattered foci of acute/ subacute  infarct and both cerebral hemispheres. These cross a watershed distribution and a more prominent left than right. Anteriorly and superiorly the infarcts are similar on both sides. The areas of infarction extend more posteriorly on the left extending to the border of the left temporal and occipital lobes. Cortical and subcortical T2 hyperintensities correspond with the areas of restricted diffusion. Mild atrophy and periventricular white matter changes are noted bilaterally as well. T2 changes extend along the cortical spinal tract on the right, likely related to remote ischemic injury. T2 changes extend into the brainstem. There is no  flow in the right internal carotid artery at the skullbase. Flow is present in the posterior circulation. Flow is present in the left internal carotid artery and bilateral MCA vessels. No significant extra-axial fluid collection is present. Bilateral lens replacements are noted. The globes and orbits are otherwise within normal limits bilaterally. Mild mucosal thickening is evident throughout the paranasal sinuses minimal fluid is present in the left maxillary sinus and right sphenoid sinus. There is more pronounced mucosal thickening in the left frontal sinus. The mastoid air cells are clear. MRA HEAD FINDINGS The right internal carotid artery is occluded. The left internal carotid artery is unremarkable from the high cervical segments through the ICA termini. The anterior communicating artery is patent. The left A1 and M1 segments are intact. The left MCA bifurcation is within normal limits. There is mild distal small vessel irregularity. There is slight decreased intensity of signal in the right A2 segments. There is significant attenuation of signal within the right A1 and M1 segments. That significantly affects the right MCA branch vessels as well. The left vertebral artery is the dominant vessel. There is a high-grade stenosis or occlusion of the right vertebral artery. The right AICA is dominant. The basilar artery is small. The right posterior cerebral artery originates from the basilar tip. The left posterior cerebral artery is of fetal type. Better signal is present in the right than left PCA branch vessels. MRA NECK FINDINGS Time-of-flight images demonstrate signal loss compatible with a high-grade stenosis at the left carotid bifurcation. The right internal carotid artery is occluded just beyond the bifurcation. Flow is antegrade in the left vertebral artery. The right vertebral artery is not visualized. The postcontrast images demonstrate a moderate stenosis at the origin of the left common carotid  artery. There is a high-grade stenosis of the distal right common carotid artery. The right internal carotid artery is occluded 8 mm beyond the bifurcation. There is signal loss compatible with a high-grade stenosis in the proximal left internal and external carotid arteries. No distal stenoses are present. There signal loss at the origin of the left vertebra artery suggesting moderate to high-grade stenosis. Mild tortuosity is present in the cervical left vertebral artery. There is no significant stenosis in the neck or intracranial. The right vertebral artery is hypoplastic with segmental narrowing beginning at the dural margin and inclusion proximal to the vertebrobasilar junction. IMPRESSION: 1. Bilateral watershed territory infarct, more prominent left than right. 2. 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 3. Attenuated flow in the right M1 segment with flow to the right via a patent anterior communicating artery comment from the left. 4. Asymmetric attenuation of flow into the right MCA branch vessels. 5. High-grade stenosis of the proximal left internal carotid artery. The left internal carotid artery feeds the entire anterior circulation and the left posterior circulation. 6. Moderate to high-grade proximal left vertebral artery stenosis.  7. High-grade stenosis and occlusion of the hypoplastic distal right vertebral artery. The right vertebral artery is not well seen in the neck and likely occluded proximally and reconstituted distally. 8. Fetal type left posterior cerebral artery. 9. Additional atrophy and white matter changes are noted in the brain. Electronically Signed   By: San Morelle M.D.   On: 06/01/2015 18:04   Mr Angiogram Neck W Wo Contrast  06/01/2015  CLINICAL DATA:  Progressive weakness in the right arm over the last week. Unable but PEG up coffee this morning. Antrum and headache. Hypertension. Carotid bruits. EXAM: MRI HEAD  WITHOUT AND WITH CONTRAST MRA HEAD WITHOUT CONTRAST MRA NECK WITHOUT AND WITH CONTRAST TECHNIQUE: Multiplanar, multiecho pulse sequences of the brain and surrounding structures were obtained without and with intravenous contrast. Angiographic images of the Circle of Willis were obtained using MRA technique without intravenous contrast. Angiographic images of the neck were obtained using MRA technique without and with intravenous contrast. Carotid stenosis measurements (when applicable) are obtained utilizing NASCET criteria, using the distal internal carotid diameter as the denominator. CONTRAST:  72mL MULTIHANCE GADOBENATE DIMEGLUMINE 529 MG/ML IV SOLN COMPARISON:  CT head from the same day. FINDINGS: MRI HEAD FINDINGS The diffusion-weighted images confirm scattered foci of acute/ subacute infarct and both cerebral hemispheres. These cross a watershed distribution and a more prominent left than right. Anteriorly and superiorly the infarcts are similar on both sides. The areas of infarction extend more posteriorly on the left extending to the border of the left temporal and occipital lobes. Cortical and subcortical T2 hyperintensities correspond with the areas of restricted diffusion. Mild atrophy and periventricular white matter changes are noted bilaterally as well. T2 changes extend along the cortical spinal tract on the right, likely related to remote ischemic injury. T2 changes extend into the brainstem. There is no flow in the right internal carotid artery at the skullbase. Flow is present in the posterior circulation. Flow is present in the left internal carotid artery and bilateral MCA vessels. No significant extra-axial fluid collection is present. Bilateral lens replacements are noted. The globes and orbits are otherwise within normal limits bilaterally. Mild mucosal thickening is evident throughout the paranasal sinuses minimal fluid is present in the left maxillary sinus and right sphenoid sinus. There  is more pronounced mucosal thickening in the left frontal sinus. The mastoid air cells are clear. MRA HEAD FINDINGS The right internal carotid artery is occluded. The left internal carotid artery is unremarkable from the high cervical segments through the ICA termini. The anterior communicating artery is patent. The left A1 and M1 segments are intact. The left MCA bifurcation is within normal limits. There is mild distal small vessel irregularity. There is slight decreased intensity of signal in the right A2 segments. There is significant attenuation of signal within the right A1 and M1 segments. That significantly affects the right MCA branch vessels as well. The left vertebral artery is the dominant vessel. There is a high-grade stenosis or occlusion of the right vertebral artery. The right AICA is dominant. The basilar artery is small. The right posterior cerebral artery originates from the basilar tip. The left posterior cerebral artery is of fetal type. Better signal is present in the right than left PCA branch vessels. MRA NECK FINDINGS Time-of-flight images demonstrate signal loss compatible with a high-grade stenosis at the left carotid bifurcation. The right internal carotid artery is occluded just beyond the bifurcation. Flow is antegrade in the left vertebral artery. The right vertebral artery is not  visualized. The postcontrast images demonstrate a moderate stenosis at the origin of the left common carotid artery. There is a high-grade stenosis of the distal right common carotid artery. The right internal carotid artery is occluded 8 mm beyond the bifurcation. There is signal loss compatible with a high-grade stenosis in the proximal left internal and external carotid arteries. No distal stenoses are present. There signal loss at the origin of the left vertebra artery suggesting moderate to high-grade stenosis. Mild tortuosity is present in the cervical left vertebral artery. There is no significant  stenosis in the neck or intracranial. The right vertebral artery is hypoplastic with segmental narrowing beginning at the dural margin and inclusion proximal to the vertebrobasilar junction. IMPRESSION: 1. Bilateral watershed territory infarct, more prominent left than right. 2. 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 3. Attenuated flow in the right M1 segment with flow to the right via a patent anterior communicating artery comment from the left. 4. Asymmetric attenuation of flow into the right MCA branch vessels. 5. High-grade stenosis of the proximal left internal carotid artery. The left internal carotid artery feeds the entire anterior circulation and the left posterior circulation. 6. Moderate to high-grade proximal left vertebral artery stenosis. 7. High-grade stenosis and occlusion of the hypoplastic distal right vertebral artery. The right vertebral artery is not well seen in the neck and likely occluded proximally and reconstituted distally. 8. Fetal type left posterior cerebral artery. 9. Additional atrophy and white matter changes are noted in the brain. Electronically Signed   By: San Morelle M.D.   On: 06/01/2015 18:04   Mr Brain Wo Contrast  06/01/2015  CLINICAL DATA:  Progressive weakness in the right arm over the last week. Unable but PEG up coffee this morning. Antrum and headache. Hypertension. Carotid bruits. EXAM: MRI HEAD WITHOUT AND WITH CONTRAST MRA HEAD WITHOUT CONTRAST MRA NECK WITHOUT AND WITH CONTRAST TECHNIQUE: Multiplanar, multiecho pulse sequences of the brain and surrounding structures were obtained without and with intravenous contrast. Angiographic images of the Circle of Willis were obtained using MRA technique without intravenous contrast. Angiographic images of the neck were obtained using MRA technique without and with intravenous contrast. Carotid stenosis measurements (when applicable) are obtained utilizing  NASCET criteria, using the distal internal carotid diameter as the denominator. CONTRAST:  59mL MULTIHANCE GADOBENATE DIMEGLUMINE 529 MG/ML IV SOLN COMPARISON:  CT head from the same day. FINDINGS: MRI HEAD FINDINGS The diffusion-weighted images confirm scattered foci of acute/ subacute infarct and both cerebral hemispheres. These cross a watershed distribution and a more prominent left than right. Anteriorly and superiorly the infarcts are similar on both sides. The areas of infarction extend more posteriorly on the left extending to the border of the left temporal and occipital lobes. Cortical and subcortical T2 hyperintensities correspond with the areas of restricted diffusion. Mild atrophy and periventricular white matter changes are noted bilaterally as well. T2 changes extend along the cortical spinal tract on the right, likely related to remote ischemic injury. T2 changes extend into the brainstem. There is no flow in the right internal carotid artery at the skullbase. Flow is present in the posterior circulation. Flow is present in the left internal carotid artery and bilateral MCA vessels. No significant extra-axial fluid collection is present. Bilateral lens replacements are noted. The globes and orbits are otherwise within normal limits bilaterally. Mild mucosal thickening is evident throughout the paranasal sinuses minimal fluid is present in the left maxillary sinus and right  sphenoid sinus. There is more pronounced mucosal thickening in the left frontal sinus. The mastoid air cells are clear. MRA HEAD FINDINGS The right internal carotid artery is occluded. The left internal carotid artery is unremarkable from the high cervical segments through the ICA termini. The anterior communicating artery is patent. The left A1 and M1 segments are intact. The left MCA bifurcation is within normal limits. There is mild distal small vessel irregularity. There is slight decreased intensity of signal in the right A2  segments. There is significant attenuation of signal within the right A1 and M1 segments. That significantly affects the right MCA branch vessels as well. The left vertebral artery is the dominant vessel. There is a high-grade stenosis or occlusion of the right vertebral artery. The right AICA is dominant. The basilar artery is small. The right posterior cerebral artery originates from the basilar tip. The left posterior cerebral artery is of fetal type. Better signal is present in the right than left PCA branch vessels. MRA NECK FINDINGS Time-of-flight images demonstrate signal loss compatible with a high-grade stenosis at the left carotid bifurcation. The right internal carotid artery is occluded just beyond the bifurcation. Flow is antegrade in the left vertebral artery. The right vertebral artery is not visualized. The postcontrast images demonstrate a moderate stenosis at the origin of the left common carotid artery. There is a high-grade stenosis of the distal right common carotid artery. The right internal carotid artery is occluded 8 mm beyond the bifurcation. There is signal loss compatible with a high-grade stenosis in the proximal left internal and external carotid arteries. No distal stenoses are present. There signal loss at the origin of the left vertebra artery suggesting moderate to high-grade stenosis. Mild tortuosity is present in the cervical left vertebral artery. There is no significant stenosis in the neck or intracranial. The right vertebral artery is hypoplastic with segmental narrowing beginning at the dural margin and inclusion proximal to the vertebrobasilar junction. IMPRESSION: 1. Bilateral watershed territory infarct, more prominent left than right. 2. 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 3. Attenuated flow in the right M1 segment with flow to the right via a patent anterior communicating artery comment from the left. 4.  Asymmetric attenuation of flow into the right MCA branch vessels. 5. High-grade stenosis of the proximal left internal carotid artery. The left internal carotid artery feeds the entire anterior circulation and the left posterior circulation. 6. Moderate to high-grade proximal left vertebral artery stenosis. 7. High-grade stenosis and occlusion of the hypoplastic distal right vertebral artery. The right vertebral artery is not well seen in the neck and likely occluded proximally and reconstituted distally. 8. Fetal type left posterior cerebral artery. 9. Additional atrophy and white matter changes are noted in the brain. Electronically Signed   By: San Morelle M.D.   On: 06/01/2015 18:04    EKG: Independently reviewed. Vent. rate 73 BPM PR interval 158 ms QRS duration 76 ms QT/QTc 373/411 ms P-R-T axes 81 24 76 Sinus rhythm RSR' in V1 or V2, right VCD or RVH Nonspecific T abnrm, anterolateral leads Baseline wander in lead(s) V4  Assessment/Plan  Principal Problem:   Acute CVA (cerebrovascular accident) ([place]) Admit to telemetry. Frequent neuro checks. Check carotid Doppler, echocardiogram, fasting lipids and hemoglobin A1c. Continue prophylactic aspirin.  Active Problems:   Alcohol dependence (HCC) Continue CIWA protocol. Magnesium sulfate 2 g IV piggyback. Thiamine 100 mg by mouth daily.    Tobacco abuse disorder Nicotine  replacement therapy ordered.    Hyponatremia This is most likely the result of hemodilution since the patient drinks a 12 pack of beer every day. Follow-up sodium level in the morning.    Hypertension Continue lisinopril and monitor blood pressure.    Bilateral carotid artery stenosis Check carotid Doppler.    Cannabis abuse, episodic Per patient, he uses cannabis very rarely. He last used yesterday.  Neurology was consulted by the emergency department.  Code Status: Full code. DVT Prophylaxis: Lovenox SQ. Family Communication:  Disposition  Plan: Admit to telemetry and continue stroke workup.  Time spent:  Over 70 minutes were spent in the process of this admission.  Reubin Milan [facility] Pager [phone].

## 2015-06-01 NOTE — ED Notes (Signed)
IN MRI with pt to give ativan. MRA also added.

## 2015-06-01 NOTE — ED Notes (Signed)
MD at [email or web address]

## 2015-06-01 NOTE — Consult Note (Signed)
Referring Physician: Dr Regenia Skeeter    Chief Complaint: right arm weakness, stroke on CT/MRI brain  HPI:                                                                                                                                         Connor Butler is an 64 y.o. male with a past medical history significant for HTN, right carotid artery occlusion and " nearly occluded left carotid" as per patient, heavy smoker, alcohol abuse, comes in for evaluation of new onset right arm weakness. Patient is right handed and said that the first thing that he notice one week ago was that he was losing her dexterity to write. Shortly thereafter, the right arm started becoming obviously weak and reports that today had gotten worse to the point where this morning it was difficult to pick up his coffee cup.  Denies weakness of the right LE " but my right leg is getting kind of clumsy". No weakness in the left hemibody. Mild HA, but no vertigo, double vision, difficulty swallowing, slurred speech, imbalance, language or vision impairment. CT brain was personally reviewed and showed subcentimeter, subacute to chronic infarcts in the high frontal lobes. However, MRI brain revealed multiple areas of acute and subacute ischemia involving both cerebral hemispheres cortically and sub cortically in a watershed pattern, chiefly over the right brain. The areas of infarction extend more posteriorly on the left extending to the border of the left temporal and occipital lobes. MRA neck and brain : high-grade stenosis of the proximal left internal carotid artery. 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 Attenuated flow in the right M1 segment with flow to the right via a patent anterior communicating artery comment from the left.Asymmetric attenuation of flow into the right MCA branch vessels.  Date last known well: unable to determine Time last known well: unable to determine.   tPA Given: no, late presentation   Past Medical History  Diagnosis Date  . Hypertension   . Carotid artery occlusion   . Arthritis     Past Surgical History  Procedure Laterality Date  . Hernia repair  2003    History reviewed. No pertinent family history. Social History:  reports that he has been smoking Cigarettes.  He has a 45 pack-year smoking history. He has never used smokeless tobacco. He reports that he drinks about 25.2 oz of alcohol per week. He reports that he does not use illicit drugs.  Allergies: No Known Allergies  Medications:  I have reviewed the patient's current medications.  ROS:                                                                                                                                       History obtained from chart review and the patient  General ROS: negative for - chills, fatigue, fever, night sweats, weight gain or weight loss Psychological ROS: negative for - behavioral disorder, hallucinations, memory difficulties, mood swings or suicidal ideation Ophthalmic ROS: negative for - blurry vision, double vision, eye pain or loss of vision ENT ROS: negative for - epistaxis, nasal discharge, oral lesions, sore throat, tinnitus or vertigo Allergy and Immunology ROS: negative for - hives or itchy/watery eyes Hematological and Lymphatic ROS: negative for - bleeding problems, bruising or swollen lymph nodes Endocrine ROS: negative for - galactorrhea, hair pattern changes, polydipsia/polyuria or temperature intolerance Respiratory ROS: negative for - cough, hemoptysis, shortness of breath or wheezing Cardiovascular ROS: negative for - chest pain, dyspnea on exertion, edema or irregular heartbeat Gastrointestinal ROS: negative for - abdominal pain, diarrhea, hematemesis, nausea/vomiting or stool incontinence Genito-Urinary  ROS: negative for - dysuria, hematuria, incontinence or urinary frequency/urgency Musculoskeletal ROS: negative for - joint swelling Neurological ROS: as noted in HPI Dermatological ROS: negative for rash and skin lesion changes    Physical exam:  Constitutional: well developed, pleasant male in no apparent distress. Blood pressure 132/74, pulse 60, resp. rate 17, height $RemoveBe'5\' 9"'[id number]$  (1.753 m), weight 82.101 kg (181 lb), SpO2 99 %. Eyes: no jaundice or exophthalmos.  Head: normocephalic. Neck: supple, no bruits, no JVD. Cardiac: no murmurs. Lungs: clear. Abdomen: soft, no tender, no mass. Extremities: no edema, clubbing, or cyanosis.  Skin: no rash   Neurologic Examination:                                                                                                      General: NAD Mental Status: Alert, oriented, thought content appropriate.  Speech fluent without evidence of aphasia.  Able to follow 3 step commands without difficulty. Cranial Nerves: II: Discs flat bilaterally; Visual fields grossly normal, pupils equal, round, reactive to light and accommodation III,IV, VI: ptosis not present, extra-ocular motions intact bilaterally V,VII: smile symmetric, facial light touch sensation normal bilaterally VIII: hearing normal bilaterally IX,X: uvula rises symmetrically XI: bilateral shoulder shrug XII: midline tongue extension without atrophy or fasciculations Motor: Significant for subtle right arm and leg drift. Tone and bulk:normal tone throughout; no atrophy noted Sensory: Pinprick and  light touch intact throughout, bilaterally Deep Tendon Reflexes:  1+ all over Plantars: Right: downgoing   Left: downgoing Cerebellar: normal finger-to-nose,  normal heel-to-shin test Gait:  No tested due to multiple leads    Results for orders placed or performed during the hospital encounter of 06/01/15 (from the past 48 hour(s))  Ethanol     Status: None   Collection Time: 06/01/15  12:28 PM  Result Value Ref Range   Alcohol, Ethyl (B) <5 <5 mg/dL    Comment:        LOWEST DETECTABLE LIMIT FOR SERUM ALCOHOL IS 5 mg/dL FOR MEDICAL PURPOSES ONLY   I-stat troponin, ED (not at [facility], [facility])     Status: None   Collection Time: 06/01/15 12:35 PM  Result Value Ref Range   Troponin i, poc 0.00 0.00 - 0.08 ng/mL   Comment 3            Comment: Due to the release kinetics of cTnI, a negative result within the first hours of the onset of symptoms does not rule out myocardial infarction with certainty. If myocardial infarction is still suspected, repeat the test at appropriate intervals.   Protime-INR     Status: None   Collection Time: 06/01/15 12:43 PM  Result Value Ref Range   Prothrombin Time 13.4 11.6 - 15.2 seconds   INR 1.00 0.00 - 1.49  APTT     Status: None   Collection Time: 06/01/15 12:43 PM  Result Value Ref Range   aPTT 28 24 - 37 seconds  CBC     Status: None   Collection Time: 06/01/15 12:43 PM  Result Value Ref Range   WBC 8.3 4.0 - 10.5 K/uL   RBC 4.33 4.22 - 5.81 MIL/uL   Hemoglobin 13.3 13.0 - 17.0 g/dL   HCT 39.4 39.0 - 52.0 %   MCV 91.0 78.0 - 100.0 fL   MCH 30.7 26.0 - 34.0 pg   MCHC 33.8 30.0 - 36.0 g/dL   RDW 12.5 11.5 - 15.5 %   Platelets 316 150 - 400 K/uL  Differential     Status: Abnormal   Collection Time: 06/01/15 12:43 PM  Result Value Ref Range   Neutrophils Relative % 59 %   Neutro Abs 4.9 1.7 - 7.7 K/uL   Lymphocytes Relative 22 %   Lymphs Abs 1.9 0.7 - 4.0 K/uL   Monocytes Relative 15 %   Monocytes Absolute 1.2 (H) 0.1 - 1.0 K/uL   Eosinophils Relative 3 %   Eosinophils Absolute 0.2 0.0 - 0.7 K/uL   Basophils Relative 1 %   Basophils Absolute 0.1 0.0 - 0.1 K/uL  Comprehensive metabolic panel     Status: Abnormal   Collection Time: 06/01/15 12:43 PM  Result Value Ref Range   Sodium 131 (L) 135 - 145 mmol/L   Potassium 4.1 3.5 - 5.1 mmol/L   Chloride 98 (L) 101 - 111 mmol/L   CO2 23 22 - 32 mmol/L   Glucose, Bld 108  (H) 65 - 99 mg/dL   BUN 9 6 - 20 mg/dL   Creatinine, Ser 1.10 0.61 - 1.24 mg/dL   Calcium 9.3 8.9 - 10.3 mg/dL   Total Protein 7.1 6.5 - 8.1 g/dL   Albumin 3.8 3.5 - 5.0 g/dL   AST 23 15 - 41 U/L   ALT 17 17 - 63 U/L   Alkaline Phosphatase 54 38 - 126 U/L   Total Bilirubin 0.5 0.3 - 1.2 mg/dL   GFR calc  non Af Amer >60 >60 mL/min   GFR calc Af Amer >60 >60 mL/min    Comment: (NOTE) The eGFR has been calculated using the CKD EPI equation. This calculation has not been validated in all clinical situations. eGFR's persistently <60 mL/min signify possible Chronic Kidney Disease.    Anion gap 10 5 - 15  Urine rapid drug screen (hosp performed)not at [facility]     Status: Abnormal   Collection Time: 06/01/15  1:15 PM  Result Value Ref Range   Opiates NONE DETECTED NONE DETECTED   Cocaine NONE DETECTED NONE DETECTED   Benzodiazepines NONE DETECTED NONE DETECTED   Amphetamines NONE DETECTED NONE DETECTED   Tetrahydrocannabinol POSITIVE (A) NONE DETECTED   Barbiturates NONE DETECTED NONE DETECTED    Comment:        DRUG SCREEN FOR MEDICAL PURPOSES ONLY.  IF CONFIRMATION IS NEEDED FOR ANY PURPOSE, NOTIFY LAB WITHIN 5 DAYS.        LOWEST DETECTABLE LIMITS FOR URINE DRUG SCREEN Drug Class       Cutoff (ng/mL) Amphetamine      1000 Barbiturate      200 Benzodiazepine   161 Tricyclics       096 Opiates          300 Cocaine          300 THC              50   Urinalysis, Routine w reflex microscopic (not at [facility])     Status: None   Collection Time: 06/01/15  1:15 PM  Result Value Ref Range   Color, Urine YELLOW YELLOW   APPearance CLEAR CLEAR   Specific Gravity, Urine 1.008 1.005 - 1.030   pH 5.5 5.0 - 8.0   Glucose, UA NEGATIVE NEGATIVE mg/dL   Hgb urine dipstick NEGATIVE NEGATIVE   Bilirubin Urine NEGATIVE NEGATIVE   Ketones, ur NEGATIVE NEGATIVE mg/dL   Protein, ur NEGATIVE NEGATIVE mg/dL   Urobilinogen, UA 0.2 0.0 - 1.0 mg/dL   Nitrite NEGATIVE NEGATIVE   Leukocytes, UA  NEGATIVE NEGATIVE    Comment: MICROSCOPIC NOT DONE ON URINES WITH NEGATIVE PROTEIN, BLOOD, LEUKOCYTES, NITRITE, OR GLUCOSE <1000 mg/dL.   Ct Head Wo Contrast  06/01/2015  CLINICAL DATA:  Right arm weakness for 1 week. Decreased grip strength. Dizziness. Possible stroke. EXAM: CT HEAD WITHOUT CONTRAST TECHNIQUE: Contiguous axial images were obtained from the base of the skull through the vertex without intravenous contrast. COMPARISON:  None. FINDINGS: There is no evidence of acute large territory infarct, intracranial hemorrhage, mass, midline shift, or extra-axial fluid collection. Mild generalized cerebral atrophy is within normal limits for age. There is a subcentimeter subacute to chronic cortical infarct in the right precentral gyrus. There is a subcentimeter subacute to chronic subcortical white matter infarct in the high left frontal lobe. Periventricular white-matter hypodensities are nonspecific but compatible with mild chronic small vessel ischemic disease. Prior bilateral cataract extraction is noted. There is evidence of chronic left maxillary sinusitis, currently with minimal mucosal thickening in a small amount of secretions present. Trace fluid is present in the right sphenoid sinus, and there is moderate left frontal sinus and mild bilateral ethmoid air cell mucosal thickening. Mastoid air cells are clear. Calcified atherosclerosis is noted at the skull base. IMPRESSION: 1. No evidence of large territory infarct or intracranial hemorrhage. No mass effect. 2. Mild chronic small vessel ischemic disease in the cerebral white matter. 3. Subcentimeter, subacute to chronic infarcts in the high frontal lobes. Electronically  Signed   By: Logan Bores M.D.   On: 06/01/2015 14:20     Assessment: 64 y.o. male with new onset right arm weakness for at least a week, and MRI brain revealing multiple areas of acute ischemia involving both cerebral hemispheres cortically and subcortically in a watershed  pattern , likely cerebral embolism arising from high-grade stenosis of the proximal left internal carotid artery and cclusion of the right internal carotid artery. Patient is not a candidate for thrombolysis due to late presentation. Admit to medicine and complete stroke work up. Vascular surgery evaluation as per stroke attending. Stroke team will resume care in the morning.  Stroke Risk Factors - age, HTN, carotid artery occlusion  Plan: 1. HgbA1c, fasting lipid panel 2. MRI, MRA  of the brain without contrast 3. Echocardiogram 4. Carotid dopplers 5. Prophylactic therapy-aspirin pending stroke work up 6. Risk factor modification 7. Telemetry monitoring 8. Frequent neuro checks 9. PT/OT SLP 10. NPO  Dorian Pod, MD [facility] [phone]  06/01/2015, 5:49 PM

## 2015-06-01 NOTE — ED Provider Notes (Signed)
Patient's MRIs show multiple strokes. Discussed with neurology who recommends admission and further workup for an embolic phenomenon. Patient is hemodynamically stable, to be admitted to telemetry for further stroke workup. Of note patient does have history of bilateral carotid stenosis and has seen vascular surgery in the past but states that he was supposed to have an appointment in June but was never called and thus never followed up on this.  Results for orders placed or performed during the hospital encounter of 06/01/15  Ethanol  Result Value Ref Range   Alcohol, Ethyl (B) <5 <5 mg/dL  Protime-INR  Result Value Ref Range   Prothrombin Time 13.4 11.6 - 15.2 seconds   INR 1.00 0.00 - 1.49  APTT  Result Value Ref Range   aPTT 28 24 - 37 seconds  CBC  Result Value Ref Range   WBC 8.3 4.0 - 10.5 K/uL   RBC 4.33 4.22 - 5.81 MIL/uL   Hemoglobin 13.3 13.0 - 17.0 g/dL   HCT 39.4 39.0 - 52.0 %   MCV 91.0 78.0 - 100.0 fL   MCH 30.7 26.0 - 34.0 pg   MCHC 33.8 30.0 - 36.0 g/dL   RDW 12.5 11.5 - 15.5 %   Platelets 316 150 - 400 K/uL  Differential  Result Value Ref Range   Neutrophils Relative % 59 %   Neutro Abs 4.9 1.7 - 7.7 K/uL   Lymphocytes Relative 22 %   Lymphs Abs 1.9 0.7 - 4.0 K/uL   Monocytes Relative 15 %   Monocytes Absolute 1.2 (H) 0.1 - 1.0 K/uL   Eosinophils Relative 3 %   Eosinophils Absolute 0.2 0.0 - 0.7 K/uL   Basophils Relative 1 %   Basophils Absolute 0.1 0.0 - 0.1 K/uL  Comprehensive metabolic panel  Result Value Ref Range   Sodium 131 (L) 135 - 145 mmol/L   Potassium 4.1 3.5 - 5.1 mmol/L   Chloride 98 (L) 101 - 111 mmol/L   CO2 23 22 - 32 mmol/L   Glucose, Bld 108 (H) 65 - 99 mg/dL   BUN 9 6 - 20 mg/dL   Creatinine, Ser 1.10 0.61 - 1.24 mg/dL   Calcium 9.3 8.9 - 10.3 mg/dL   Total Protein 7.1 6.5 - 8.1 g/dL   Albumin 3.8 3.5 - 5.0 g/dL   AST 23 15 - 41 U/L   ALT 17 17 - 63 U/L   Alkaline Phosphatase 54 38 - 126 U/L   Total Bilirubin 0.5 0.3 - 1.2 mg/dL    GFR calc non Af Amer >60 >60 mL/min   GFR calc Af Amer >60 >60 mL/min   Anion gap 10 5 - 15  Urine rapid drug screen (hosp performed)not at [facility]  Result Value Ref Range   Opiates NONE DETECTED NONE DETECTED   Cocaine NONE DETECTED NONE DETECTED   Benzodiazepines NONE DETECTED NONE DETECTED   Amphetamines NONE DETECTED NONE DETECTED   Tetrahydrocannabinol POSITIVE (A) NONE DETECTED   Barbiturates NONE DETECTED NONE DETECTED  Urinalysis, Routine w reflex microscopic (not at [facility])  Result Value Ref Range   Color, Urine YELLOW YELLOW   APPearance CLEAR CLEAR   Specific Gravity, Urine 1.008 1.005 - 1.030   pH 5.5 5.0 - 8.0   Glucose, UA NEGATIVE NEGATIVE mg/dL   Hgb urine dipstick NEGATIVE NEGATIVE   Bilirubin Urine NEGATIVE NEGATIVE   Ketones, ur NEGATIVE NEGATIVE mg/dL   Protein, ur NEGATIVE NEGATIVE mg/dL   Urobilinogen, UA 0.2 0.0 - 1.0 mg/dL  Nitrite NEGATIVE NEGATIVE   Leukocytes, UA NEGATIVE NEGATIVE  I-stat troponin, ED (not at [facility])  Result Value Ref Range   Troponin i, poc 0.00 0.00 - 0.08 ng/mL   Comment 3           Ct Head Wo Contrast  06/01/2015  CLINICAL DATA:  Right arm weakness for 1 week. Decreased grip strength. Dizziness. Possible stroke. EXAM: CT HEAD WITHOUT CONTRAST TECHNIQUE: Contiguous axial images were obtained from the base of the skull through the vertex without intravenous contrast. COMPARISON:  None. FINDINGS: There is no evidence of acute large territory infarct, intracranial hemorrhage, mass, midline shift, or extra-axial fluid collection. Mild generalized cerebral atrophy is within normal limits for age. There is a subcentimeter subacute to chronic cortical infarct in the right precentral gyrus. There is a subcentimeter subacute to chronic subcortical white matter infarct in the high left frontal lobe. Periventricular white-matter hypodensities are nonspecific but compatible with mild chronic small vessel ischemic disease. Prior bilateral cataract  extraction is noted. There is evidence of chronic left maxillary sinusitis, currently with minimal mucosal thickening in a small amount of secretions present. Trace fluid is present in the right sphenoid sinus, and there is moderate left frontal sinus and mild bilateral ethmoid air cell mucosal thickening. Mastoid air cells are clear. Calcified atherosclerosis is noted at the skull base. IMPRESSION: 1. No evidence of large territory infarct or intracranial hemorrhage. No mass effect. 2. Mild chronic small vessel ischemic disease in the cerebral white matter. 3. Subcentimeter, subacute to chronic infarcts in the high frontal lobes. Electronically Signed   By: Logan Bores M.D.   On: 06/01/2015 14:20   Mr Jodene Nam Head Wo Contrast  06/01/2015  CLINICAL DATA:  Progressive weakness in the right arm over the last week. Unable but PEG up coffee this morning. Antrum and headache. Hypertension. Carotid bruits. EXAM: MRI HEAD WITHOUT AND WITH CONTRAST MRA HEAD WITHOUT CONTRAST MRA NECK WITHOUT AND WITH CONTRAST TECHNIQUE: Multiplanar, multiecho pulse sequences of the brain and surrounding structures were obtained without and with intravenous contrast. Angiographic images of the Circle of Willis were obtained using MRA technique without intravenous contrast. Angiographic images of the neck were obtained using MRA technique without and with intravenous contrast. Carotid stenosis measurements (when applicable) are obtained utilizing NASCET criteria, using the distal internal carotid diameter as the denominator. CONTRAST:  25mL MULTIHANCE GADOBENATE DIMEGLUMINE 529 MG/ML IV SOLN COMPARISON:  CT head from the same day. FINDINGS: MRI HEAD FINDINGS The diffusion-weighted images confirm scattered foci of acute/ subacute infarct and both cerebral hemispheres. These cross a watershed distribution and a more prominent left than right. Anteriorly and superiorly the infarcts are similar on both sides. The areas of infarction extend more  posteriorly on the left extending to the border of the left temporal and occipital lobes. Cortical and subcortical T2 hyperintensities correspond with the areas of restricted diffusion. Mild atrophy and periventricular white matter changes are noted bilaterally as well. T2 changes extend along the cortical spinal tract on the right, likely related to remote ischemic injury. T2 changes extend into the brainstem. There is no flow in the right internal carotid artery at the skullbase. Flow is present in the posterior circulation. Flow is present in the left internal carotid artery and bilateral MCA vessels. No significant extra-axial fluid collection is present. Bilateral lens replacements are noted. The globes and orbits are otherwise within normal limits bilaterally. Mild mucosal thickening is evident throughout the paranasal sinuses minimal fluid is present in the  left maxillary sinus and right sphenoid sinus. There is more pronounced mucosal thickening in the left frontal sinus. The mastoid air cells are clear. MRA HEAD FINDINGS The right internal carotid artery is occluded. The left internal carotid artery is unremarkable from the high cervical segments through the ICA termini. The anterior communicating artery is patent. The left A1 and M1 segments are intact. The left MCA bifurcation is within normal limits. There is mild distal small vessel irregularity. There is slight decreased intensity of signal in the right A2 segments. There is significant attenuation of signal within the right A1 and M1 segments. That significantly affects the right MCA branch vessels as well. The left vertebral artery is the dominant vessel. There is a high-grade stenosis or occlusion of the right vertebral artery. The right AICA is dominant. The basilar artery is small. The right posterior cerebral artery originates from the basilar tip. The left posterior cerebral artery is of fetal type. Better signal is present in the right than  left PCA branch vessels. MRA NECK FINDINGS Time-of-flight images demonstrate signal loss compatible with a high-grade stenosis at the left carotid bifurcation. The right internal carotid artery is occluded just beyond the bifurcation. Flow is antegrade in the left vertebral artery. The right vertebral artery is not visualized. The postcontrast images demonstrate a moderate stenosis at the origin of the left common carotid artery. There is a high-grade stenosis of the distal right common carotid artery. The right internal carotid artery is occluded 8 mm beyond the bifurcation. There is signal loss compatible with a high-grade stenosis in the proximal left internal and external carotid arteries. No distal stenoses are present. There signal loss at the origin of the left vertebra artery suggesting moderate to high-grade stenosis. Mild tortuosity is present in the cervical left vertebral artery. There is no significant stenosis in the neck or intracranial. The right vertebral artery is hypoplastic with segmental narrowing beginning at the dural margin and inclusion proximal to the vertebrobasilar junction. IMPRESSION: 1. Bilateral watershed territory infarct, more prominent left than right. 2. 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 3. Attenuated flow in the right M1 segment with flow to the right via a patent anterior communicating artery comment from the left. 4. Asymmetric attenuation of flow into the right MCA branch vessels. 5. High-grade stenosis of the proximal left internal carotid artery. The left internal carotid artery feeds the entire anterior circulation and the left posterior circulation. 6. Moderate to high-grade proximal left vertebral artery stenosis. 7. High-grade stenosis and occlusion of the hypoplastic distal right vertebral artery. The right vertebral artery is not well seen in the neck and likely occluded proximally and reconstituted  distally. 8. Fetal type left posterior cerebral artery. 9. Additional atrophy and white matter changes are noted in the brain. Electronically Signed   By: San Morelle M.D.   On: 06/01/2015 18:04   Mr Angiogram Neck W Wo Contrast  06/01/2015  CLINICAL DATA:  Progressive weakness in the right arm over the last week. Unable but PEG up coffee this morning. Antrum and headache. Hypertension. Carotid bruits. EXAM: MRI HEAD WITHOUT AND WITH CONTRAST MRA HEAD WITHOUT CONTRAST MRA NECK WITHOUT AND WITH CONTRAST TECHNIQUE: Multiplanar, multiecho pulse sequences of the brain and surrounding structures were obtained without and with intravenous contrast. Angiographic images of the Circle of Willis were obtained using MRA technique without intravenous contrast. Angiographic images of the neck were obtained using MRA technique without and with intravenous contrast.  Carotid stenosis measurements (when applicable) are obtained utilizing NASCET criteria, using the distal internal carotid diameter as the denominator. CONTRAST:  78mL MULTIHANCE GADOBENATE DIMEGLUMINE 529 MG/ML IV SOLN COMPARISON:  CT head from the same day. FINDINGS: MRI HEAD FINDINGS The diffusion-weighted images confirm scattered foci of acute/ subacute infarct and both cerebral hemispheres. These cross a watershed distribution and a more prominent left than right. Anteriorly and superiorly the infarcts are similar on both sides. The areas of infarction extend more posteriorly on the left extending to the border of the left temporal and occipital lobes. Cortical and subcortical T2 hyperintensities correspond with the areas of restricted diffusion. Mild atrophy and periventricular white matter changes are noted bilaterally as well. T2 changes extend along the cortical spinal tract on the right, likely related to remote ischemic injury. T2 changes extend into the brainstem. There is no flow in the right internal carotid artery at the skullbase. Flow is  present in the posterior circulation. Flow is present in the left internal carotid artery and bilateral MCA vessels. No significant extra-axial fluid collection is present. Bilateral lens replacements are noted. The globes and orbits are otherwise within normal limits bilaterally. Mild mucosal thickening is evident throughout the paranasal sinuses minimal fluid is present in the left maxillary sinus and right sphenoid sinus. There is more pronounced mucosal thickening in the left frontal sinus. The mastoid air cells are clear. MRA HEAD FINDINGS The right internal carotid artery is occluded. The left internal carotid artery is unremarkable from the high cervical segments through the ICA termini. The anterior communicating artery is patent. The left A1 and M1 segments are intact. The left MCA bifurcation is within normal limits. There is mild distal small vessel irregularity. There is slight decreased intensity of signal in the right A2 segments. There is significant attenuation of signal within the right A1 and M1 segments. That significantly affects the right MCA branch vessels as well. The left vertebral artery is the dominant vessel. There is a high-grade stenosis or occlusion of the right vertebral artery. The right AICA is dominant. The basilar artery is small. The right posterior cerebral artery originates from the basilar tip. The left posterior cerebral artery is of fetal type. Better signal is present in the right than left PCA branch vessels. MRA NECK FINDINGS Time-of-flight images demonstrate signal loss compatible with a high-grade stenosis at the left carotid bifurcation. The right internal carotid artery is occluded just beyond the bifurcation. Flow is antegrade in the left vertebral artery. The right vertebral artery is not visualized. The postcontrast images demonstrate a moderate stenosis at the origin of the left common carotid artery. There is a high-grade stenosis of the distal right common  carotid artery. The right internal carotid artery is occluded 8 mm beyond the bifurcation. There is signal loss compatible with a high-grade stenosis in the proximal left internal and external carotid arteries. No distal stenoses are present. There signal loss at the origin of the left vertebra artery suggesting moderate to high-grade stenosis. Mild tortuosity is present in the cervical left vertebral artery. There is no significant stenosis in the neck or intracranial. The right vertebral artery is hypoplastic with segmental narrowing beginning at the dural margin and inclusion proximal to the vertebrobasilar junction. IMPRESSION: 1. Bilateral watershed territory infarct, more prominent left than right. 2. 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 3. Attenuated flow in the right M1 segment with flow to the right via a patent anterior communicating  artery comment from the left. 4. Asymmetric attenuation of flow into the right MCA branch vessels. 5. High-grade stenosis of the proximal left internal carotid artery. The left internal carotid artery feeds the entire anterior circulation and the left posterior circulation. 6. Moderate to high-grade proximal left vertebral artery stenosis. 7. High-grade stenosis and occlusion of the hypoplastic distal right vertebral artery. The right vertebral artery is not well seen in the neck and likely occluded proximally and reconstituted distally. 8. Fetal type left posterior cerebral artery. 9. Additional atrophy and white matter changes are noted in the brain. Electronically Signed   By: San Morelle M.D.   On: 06/01/2015 18:04   Mr Brain Wo Contrast  06/01/2015  CLINICAL DATA:  Progressive weakness in the right arm over the last week. Unable but PEG up coffee this morning. Antrum and headache. Hypertension. Carotid bruits. EXAM: MRI HEAD WITHOUT AND WITH CONTRAST MRA HEAD WITHOUT CONTRAST MRA NECK WITHOUT AND WITH  CONTRAST TECHNIQUE: Multiplanar, multiecho pulse sequences of the brain and surrounding structures were obtained without and with intravenous contrast. Angiographic images of the Circle of Willis were obtained using MRA technique without intravenous contrast. Angiographic images of the neck were obtained using MRA technique without and with intravenous contrast. Carotid stenosis measurements (when applicable) are obtained utilizing NASCET criteria, using the distal internal carotid diameter as the denominator. CONTRAST:  75mL MULTIHANCE GADOBENATE DIMEGLUMINE 529 MG/ML IV SOLN COMPARISON:  CT head from the same day. FINDINGS: MRI HEAD FINDINGS The diffusion-weighted images confirm scattered foci of acute/ subacute infarct and both cerebral hemispheres. These cross a watershed distribution and a more prominent left than right. Anteriorly and superiorly the infarcts are similar on both sides. The areas of infarction extend more posteriorly on the left extending to the border of the left temporal and occipital lobes. Cortical and subcortical T2 hyperintensities correspond with the areas of restricted diffusion. Mild atrophy and periventricular white matter changes are noted bilaterally as well. T2 changes extend along the cortical spinal tract on the right, likely related to remote ischemic injury. T2 changes extend into the brainstem. There is no flow in the right internal carotid artery at the skullbase. Flow is present in the posterior circulation. Flow is present in the left internal carotid artery and bilateral MCA vessels. No significant extra-axial fluid collection is present. Bilateral lens replacements are noted. The globes and orbits are otherwise within normal limits bilaterally. Mild mucosal thickening is evident throughout the paranasal sinuses minimal fluid is present in the left maxillary sinus and right sphenoid sinus. There is more pronounced mucosal thickening in the left frontal sinus. The mastoid  air cells are clear. MRA HEAD FINDINGS The right internal carotid artery is occluded. The left internal carotid artery is unremarkable from the high cervical segments through the ICA termini. The anterior communicating artery is patent. The left A1 and M1 segments are intact. The left MCA bifurcation is within normal limits. There is mild distal small vessel irregularity. There is slight decreased intensity of signal in the right A2 segments. There is significant attenuation of signal within the right A1 and M1 segments. That significantly affects the right MCA branch vessels as well. The left vertebral artery is the dominant vessel. There is a high-grade stenosis or occlusion of the right vertebral artery. The right AICA is dominant. The basilar artery is small. The right posterior cerebral artery originates from the basilar tip. The left posterior cerebral artery is of fetal type. Better signal is present in the right  than left PCA branch vessels. MRA NECK FINDINGS Time-of-flight images demonstrate signal loss compatible with a high-grade stenosis at the left carotid bifurcation. The right internal carotid artery is occluded just beyond the bifurcation. Flow is antegrade in the left vertebral artery. The right vertebral artery is not visualized. The postcontrast images demonstrate a moderate stenosis at the origin of the left common carotid artery. There is a high-grade stenosis of the distal right common carotid artery. The right internal carotid artery is occluded 8 mm beyond the bifurcation. There is signal loss compatible with a high-grade stenosis in the proximal left internal and external carotid arteries. No distal stenoses are present. There signal loss at the origin of the left vertebra artery suggesting moderate to high-grade stenosis. Mild tortuosity is present in the cervical left vertebral artery. There is no significant stenosis in the neck or intracranial. The right vertebral artery is hypoplastic  with segmental narrowing beginning at the dural margin and inclusion proximal to the vertebrobasilar junction. IMPRESSION: 1. Bilateral watershed territory infarct, more prominent left than right. 2. 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 3. Attenuated flow in the right M1 segment with flow to the right via a patent anterior communicating artery comment from the left. 4. Asymmetric attenuation of flow into the right MCA branch vessels. 5. High-grade stenosis of the proximal left internal carotid artery. The left internal carotid artery feeds the entire anterior circulation and the left posterior circulation. 6. Moderate to high-grade proximal left vertebral artery stenosis. 7. High-grade stenosis and occlusion of the hypoplastic distal right vertebral artery. The right vertebral artery is not well seen in the neck and likely occluded proximally and reconstituted distally. 8. Fetal type left posterior cerebral artery. 9. Additional atrophy and white matter changes are noted in the brain. Electronically Signed   By: San Morelle M.D.   On: 06/01/2015 18:04      Sherwood Gambler, MD 06/01/15 1820

## 2015-06-01 NOTE — ED Notes (Signed)
Pt states over 1 week he started having some weakness in his right arm that has progressively gotten worse to the point where this morning it was difficult to pick up his coffee cup. Pt also reports intermittent headache. Pt states his bp was high this am also. Pt is alert and oriented to person and place, confused about time. Pt also reports 12 pack a day of beer.

## 2015-06-01 NOTE — ED Notes (Signed)
DR.mesner at bedside.

## 2015-06-01 NOTE — ED Notes (Signed)
MD at bedside. [place] 

## 2015-06-01 NOTE — ED Notes (Signed)
Dr. Aram Beecham at bedside, informed pt that he is in MRI.

## 2015-06-01 NOTE — ED Notes (Signed)
Admitting at bedside 

## 2015-06-01 NOTE — ED Notes (Signed)
Report attempted. States to call back at [phone] for report.

## 2015-06-02 ENCOUNTER — Inpatient Hospital Stay (HOSPITAL_COMMUNITY): Payer: Medicare HMO

## 2015-06-02 DIAGNOSIS — I639 Cerebral infarction, unspecified: Secondary | ICD-10-CM

## 2015-06-02 DIAGNOSIS — E871 Hypo-osmolality and hyponatremia: Secondary | ICD-10-CM

## 2015-06-02 DIAGNOSIS — F1210 Cannabis abuse, uncomplicated: Secondary | ICD-10-CM

## 2015-06-02 DIAGNOSIS — I6522 Occlusion and stenosis of left carotid artery: Secondary | ICD-10-CM

## 2015-06-02 DIAGNOSIS — I6789 Other cerebrovascular disease: Secondary | ICD-10-CM

## 2015-06-02 DIAGNOSIS — Z720 Tobacco use: Secondary | ICD-10-CM

## 2015-06-02 LAB — COMPREHENSIVE METABOLIC PANEL
ALBUMIN: 3.3 g/dL — AB (ref 3.5–5.0)
ALT: 15 U/L — AB (ref 17–63)
AST: 18 U/L (ref 15–41)
Alkaline Phosphatase: 48 U/L (ref 38–126)
Anion gap: 7 (ref 5–15)
BILIRUBIN TOTAL: 0.4 mg/dL (ref 0.3–1.2)
BUN: 7 mg/dL (ref 6–20)
CHLORIDE: 102 mmol/L (ref 101–111)
CO2: 29 mmol/L (ref 22–32)
CREATININE: 1.05 mg/dL (ref 0.61–1.24)
Calcium: 9.5 mg/dL (ref 8.9–10.3)
Glucose, Bld: 94 mg/dL (ref 65–99)
POTASSIUM: 4.5 mmol/L (ref 3.5–5.1)
Sodium: 138 mmol/L (ref 135–145)
TOTAL PROTEIN: 6.4 g/dL — AB (ref 6.5–8.1)

## 2015-06-02 LAB — LIPID PANEL
CHOL/HDL RATIO: 3.6 ratio
CHOLESTEROL: 183 mg/dL (ref 0–200)
HDL: 51 mg/dL (ref >40)
LDL CALC: 112 mg/dL — AB (ref 0–99)
TRIGLYCERIDES: 99 mg/dL (ref <150)
VLDL: 20 mg/dL (ref 0–40)

## 2015-06-02 LAB — CBC
HCT: 38.7 % — ABNORMAL LOW (ref 39.0–52.0)
Hemoglobin: 12.6 g/dL — ABNORMAL LOW (ref 13.0–17.0)
MCH: 30 pg (ref 26.0–34.0)
MCHC: 32.6 g/dL (ref 30.0–36.0)
MCV: 92.1 fL (ref 78.0–100.0)
PLATELETS: 294 10*3/uL (ref 150–400)
RBC: 4.2 MIL/uL — AB (ref 4.22–5.81)
RDW: 12.7 % (ref 11.5–15.5)
WBC: 6.9 10*3/uL (ref 4.0–10.5)

## 2015-06-02 LAB — HEMOGLOBIN A1C
HEMOGLOBIN A1C: 5.6 % (ref 4.8–5.6)
MEAN PLASMA GLUCOSE: 114 mg/dL

## 2015-06-02 MED ORDER — TRAMADOL HCL 50 MG PO TABS: 50.0000 mg | ORAL_TABLET | Freq: Four times a day (QID) | ORAL | Status: DC | PRN

## 2015-06-02 MED ORDER — ATORVASTATIN CALCIUM 10 MG PO TABS
10.0000 mg | ORAL_TABLET | Freq: Every day | ORAL | Status: DC
  Administered 2015-06-02: 10 mg via ORAL
  Filled 2015-06-02 (×2): qty 1

## 2015-06-02 MED ORDER — FOLIC ACID 1 MG PO TABS
1.0000 mg | ORAL_TABLET | Freq: Every day | ORAL | Status: DC
  Administered 2015-06-02 – 2015-06-03 (×2): 1 mg via ORAL
  Filled 2015-06-02 (×2): qty 1

## 2015-06-02 MED ORDER — IBUPROFEN 200 MG PO TABS
600.0000 mg | ORAL_TABLET | Freq: Four times a day (QID) | ORAL | Status: DC | PRN
  Administered 2015-06-02: 600 mg via ORAL
  Filled 2015-06-02: qty 3

## 2015-06-02 MED ORDER — ADULT MULTIVITAMIN W/MINERALS CH
1.0000 | ORAL_TABLET | Freq: Every day | ORAL | Status: DC
  Administered 2015-06-02 – 2015-06-03 (×2): 1 via ORAL
  Filled 2015-06-02 (×2): qty 1

## 2015-06-02 NOTE — Progress Notes (Signed)
[facility]                                                                              Patient Demographics  Connor Butler, is a 64 y.o. male, DOB - 1951-08-05, PIR:[id number]  Admit date - 06/01/2015   Admitting Physician Reubin Milan, MD  Outpatient Primary MD for the patient is Simona Huh, MD  LOS - 1   Chief Complaint  Patient presents with  . Weakness      HPI on 06/01/2015 by Dr. Tennis Must Connor Butler is a 64 y.o. male with a past medical history of alcohol abuse, 2 pack per day cigarette smoker, hypertension, bilateral carotid artery disease (per patient right side is occluded and left side is nearly occluded) who is coming with a week history of right sided extremities weakness. Per patient, he noticed about a week ago that he was having some difficulty with his handwriting and handling things. This continued to happen in a very slow wave, but this morning the patient had significant difficulty picking up a couple coffee and also noticed that his right lower extremity was clumsy. He has had a mild headache, but denies dizziness, vision changes, dysphagia, speech or language abnormalities.  Workup in the ER showed a negative CT scan of the brain, but follow-up MRI showed multiple areas of acute and subacute infarctions in both cerebral hemispheres cortically and so cortically in the watershed pattern, mostly on the right. When seen in the ER the patient was in no acute distress.  Assessment & Plan   Acute CVA -MRI of brain showed bilateral watershed territory infarct, more prominent on the left than right -MRA head and neck showed occlusion of the right ICA, high-grade stenosis of the left ICA, moderate to high-grade left vertebral artery stenosis, high-grade stenosis and occlusion of the hypoplastic distal right vertebral artery -Echocardiogram: EF of 55-60%, no PFO or ASD -Carotid Doppler pending -Neurology consulted and appreciated, recommended  vascular surgery consult -LDL 112, hemoglobin A1c pending -PT and OT consulted and pending -Continue aspirin, will start on low-dose statin  Alcohol dependence/polysubstance abuse -Continue CIWA protocol, multivitamin, thiamine, folic acid -Patient counseled -Currently no evidence of DTs -Toxicology screen positive for THC, patient counseled  Tobacco abuse disorder -Smoking cessation discussed -Continue nicotine patch  Hyponatremia -Resolved, sodium currently 138  Hypertension -Will hold lisinopril, will allow for permissive hypertension  Bilateral carotid artery stenosis -Vascular surgery consulted and appreciated    Code Status: Full  Family Communication:  None at bedside  Disposition Plan: Admitted  Time Spent in minutes   30 minutes  Procedures  Echocardiogram Carotid doppler  Consults   Neurology Vascular surgery  DVT Prophylaxis  lovenox  Lab Results  Component Value Date   PLT 294 06/02/2015    Medications  Scheduled Meds: . aspirin EC  81 mg Oral Daily  . enoxaparin (LOVENOX) injection  40 mg Subcutaneous Q24H  . lisinopril  10 mg Oral Daily  . LORazepam  0-4 mg Intravenous 4 times per day  . LORazepam  0-4 mg Intravenous Q12H  . LORazepam  0-4 mg Oral 4 times per day  . LORazepam  0-4 mg Oral Q12H  .  nicotine  21 mg Transdermal Daily  . sodium chloride  3 mL Intravenous Q12H  . thiamine  100 mg Oral Daily   Continuous Infusions:  PRN Meds:.senna-docusate  Antibiotics    Anti-infectives    None      Subjective:   Connor Butler seen and examined today.  Patient feels his right hand weakness and grip have improved.  Would like to go home soon.  Denies chest pain, shortness of breath, abdominal pain, headache, dizziness.   Objective:   Filed Vitals:   06/02/15 0400 06/02/15 0600 06/02/15 0803 06/02/15 1017  BP: 137/67 108/52 142/67 146/75  Pulse: 65 76 74 77  Temp: 98 F (36.7 C) 97.9 F (36.6 C) 98.8 F (37.1 C) 98.1 F (36.7  C)  TempSrc: Oral Oral Oral Oral  Resp: 16 18 18 18   Height:      Weight:      SpO2: 100% 97% 97% 100%    Wt Readings from Last 3 Encounters:  06/01/15 82.101 kg (181 lb)  07/20/14 82.101 kg (181 lb)  12/01/13 82.101 kg (181 lb)     Intake/Output Summary (Last 24 hours) at 06/02/15 1409 Last data filed at 06/02/15 0050  Gross per 24 hour  Intake      0 ml  Output    200 ml  Net   -200 ml    Exam  General: Well developed, well nourished, NAD, appears stated age  HEENT: NCAT, mucous membranes moist.   Cardiovascular: S1 S2 auscultated, no rubs, murmurs or gallops. Regular rate and rhythm.  Respiratory: Clear to auscultation bilaterally with equal chest rise  Abdomen: Soft, nontender, nondistended, + bowel sounds  Extremities: warm dry without cyanosis clubbing or edema  Neuro: AAOx3, right sided mildly weaker, good grip strength. CN intact.  Psych: Normal affect and demeanor   Data Review   Micro Results No results found for this or any previous visit (from the past 240 hour(s)).  Radiology Reports Dg Chest 2 View  06/01/2015  CLINICAL DATA:  Stroke symptoms, RIGHT-side numbness and weakness, hypertension, history carotid arterial occlusion EXAM: CHEST  2 VIEW COMPARISON:  None FINDINGS: Normal heart size, mediastinal contours, and pulmonary vascularity. Emphysematous changes without infiltrate, pleural effusion or pneumothorax. Bones unremarkable. IMPRESSION: COPD changes. No acute abnormalities. Electronically Signed   By: Lavonia Dana M.D.   On: 06/01/2015 21:08   Ct Head Wo Contrast  06/01/2015  CLINICAL DATA:  Right arm weakness for 1 week. Decreased grip strength. Dizziness. Possible stroke. EXAM: CT HEAD WITHOUT CONTRAST TECHNIQUE: Contiguous axial images were obtained from the base of the skull through the vertex without intravenous contrast. COMPARISON:  None. FINDINGS: There is no evidence of acute large territory infarct, intracranial hemorrhage, mass,  midline shift, or extra-axial fluid collection. Mild generalized cerebral atrophy is within normal limits for age. There is a subcentimeter subacute to chronic cortical infarct in the right precentral gyrus. There is a subcentimeter subacute to chronic subcortical white matter infarct in the high left frontal lobe. Periventricular white-matter hypodensities are nonspecific but compatible with mild chronic small vessel ischemic disease. Prior bilateral cataract extraction is noted. There is evidence of chronic left maxillary sinusitis, currently with minimal mucosal thickening in a small amount of secretions present. Trace fluid is present in the right sphenoid sinus, and there is moderate left frontal sinus and mild bilateral ethmoid air cell mucosal thickening. Mastoid air cells are clear. Calcified atherosclerosis is noted at the skull base. IMPRESSION: 1. No evidence of large  territory infarct or intracranial hemorrhage. No mass effect. 2. Mild chronic small vessel ischemic disease in the cerebral white matter. 3. Subcentimeter, subacute to chronic infarcts in the high frontal lobes. Electronically Signed   By: Logan Bores M.D.   On: 06/01/2015 14:20   Mr Jodene Nam Head Wo Contrast  06/01/2015  CLINICAL DATA:  Progressive weakness in the right arm over the last week. Unable but PEG up coffee this morning. Antrum and headache. Hypertension. Carotid bruits. EXAM: MRI HEAD WITHOUT AND WITH CONTRAST MRA HEAD WITHOUT CONTRAST MRA NECK WITHOUT AND WITH CONTRAST TECHNIQUE: Multiplanar, multiecho pulse sequences of the brain and surrounding structures were obtained without and with intravenous contrast. Angiographic images of the Circle of Willis were obtained using MRA technique without intravenous contrast. Angiographic images of the neck were obtained using MRA technique without and with intravenous contrast. Carotid stenosis measurements (when applicable) are obtained utilizing NASCET criteria, using the distal  internal carotid diameter as the denominator. CONTRAST:  20mL MULTIHANCE GADOBENATE DIMEGLUMINE 529 MG/ML IV SOLN COMPARISON:  CT head from the same day. FINDINGS: MRI HEAD FINDINGS The diffusion-weighted images confirm scattered foci of acute/ subacute infarct and both cerebral hemispheres. These cross a watershed distribution and a more prominent left than right. Anteriorly and superiorly the infarcts are similar on both sides. The areas of infarction extend more posteriorly on the left extending to the border of the left temporal and occipital lobes. Cortical and subcortical T2 hyperintensities correspond with the areas of restricted diffusion. Mild atrophy and periventricular white matter changes are noted bilaterally as well. T2 changes extend along the cortical spinal tract on the right, likely related to remote ischemic injury. T2 changes extend into the brainstem. There is no flow in the right internal carotid artery at the skullbase. Flow is present in the posterior circulation. Flow is present in the left internal carotid artery and bilateral MCA vessels. No significant extra-axial fluid collection is present. Bilateral lens replacements are noted. The globes and orbits are otherwise within normal limits bilaterally. Mild mucosal thickening is evident throughout the paranasal sinuses minimal fluid is present in the left maxillary sinus and right sphenoid sinus. There is more pronounced mucosal thickening in the left frontal sinus. The mastoid air cells are clear. MRA HEAD FINDINGS The right internal carotid artery is occluded. The left internal carotid artery is unremarkable from the high cervical segments through the ICA termini. The anterior communicating artery is patent. The left A1 and M1 segments are intact. The left MCA bifurcation is within normal limits. There is mild distal small vessel irregularity. There is slight decreased intensity of signal in the right A2 segments. There is significant  attenuation of signal within the right A1 and M1 segments. That significantly affects the right MCA branch vessels as well. The left vertebral artery is the dominant vessel. There is a high-grade stenosis or occlusion of the right vertebral artery. The right AICA is dominant. The basilar artery is small. The right posterior cerebral artery originates from the basilar tip. The left posterior cerebral artery is of fetal type. Better signal is present in the right than left PCA branch vessels. MRA NECK FINDINGS Time-of-flight images demonstrate signal loss compatible with a high-grade stenosis at the left carotid bifurcation. The right internal carotid artery is occluded just beyond the bifurcation. Flow is antegrade in the left vertebral artery. The right vertebral artery is not visualized. The postcontrast images demonstrate a moderate stenosis at the origin of the left common carotid artery. There is  a high-grade stenosis of the distal right common carotid artery. The right internal carotid artery is occluded 8 mm beyond the bifurcation. There is signal loss compatible with a high-grade stenosis in the proximal left internal and external carotid arteries. No distal stenoses are present. There signal loss at the origin of the left vertebra artery suggesting moderate to high-grade stenosis. Mild tortuosity is present in the cervical left vertebral artery. There is no significant stenosis in the neck or intracranial. The right vertebral artery is hypoplastic with segmental narrowing beginning at the dural margin and inclusion proximal to the vertebrobasilar junction. IMPRESSION: 1. Bilateral watershed territory infarct, more prominent left than right. 2. 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 3. Attenuated flow in the right M1 segment with flow to the right via a patent anterior communicating artery comment from the left. 4. Asymmetric attenuation of flow into  the right MCA branch vessels. 5. High-grade stenosis of the proximal left internal carotid artery. The left internal carotid artery feeds the entire anterior circulation and the left posterior circulation. 6. Moderate to high-grade proximal left vertebral artery stenosis. 7. High-grade stenosis and occlusion of the hypoplastic distal right vertebral artery. The right vertebral artery is not well seen in the neck and likely occluded proximally and reconstituted distally. 8. Fetal type left posterior cerebral artery. 9. Additional atrophy and white matter changes are noted in the brain. Electronically Signed   By: San Morelle M.D.   On: 06/01/2015 18:04   Mr Angiogram Neck W Wo Contrast  06/01/2015  CLINICAL DATA:  Progressive weakness in the right arm over the last week. Unable but PEG up coffee this morning. Antrum and headache. Hypertension. Carotid bruits. EXAM: MRI HEAD WITHOUT AND WITH CONTRAST MRA HEAD WITHOUT CONTRAST MRA NECK WITHOUT AND WITH CONTRAST TECHNIQUE: Multiplanar, multiecho pulse sequences of the brain and surrounding structures were obtained without and with intravenous contrast. Angiographic images of the Circle of Willis were obtained using MRA technique without intravenous contrast. Angiographic images of the neck were obtained using MRA technique without and with intravenous contrast. Carotid stenosis measurements (when applicable) are obtained utilizing NASCET criteria, using the distal internal carotid diameter as the denominator. CONTRAST:  75mL MULTIHANCE GADOBENATE DIMEGLUMINE 529 MG/ML IV SOLN COMPARISON:  CT head from the same day. FINDINGS: MRI HEAD FINDINGS The diffusion-weighted images confirm scattered foci of acute/ subacute infarct and both cerebral hemispheres. These cross a watershed distribution and a more prominent left than right. Anteriorly and superiorly the infarcts are similar on both sides. The areas of infarction extend more posteriorly on the left  extending to the border of the left temporal and occipital lobes. Cortical and subcortical T2 hyperintensities correspond with the areas of restricted diffusion. Mild atrophy and periventricular white matter changes are noted bilaterally as well. T2 changes extend along the cortical spinal tract on the right, likely related to remote ischemic injury. T2 changes extend into the brainstem. There is no flow in the right internal carotid artery at the skullbase. Flow is present in the posterior circulation. Flow is present in the left internal carotid artery and bilateral MCA vessels. No significant extra-axial fluid collection is present. Bilateral lens replacements are noted. The globes and orbits are otherwise within normal limits bilaterally. Mild mucosal thickening is evident throughout the paranasal sinuses minimal fluid is present in the left maxillary sinus and right sphenoid sinus. There is more pronounced mucosal thickening in the left frontal sinus. The mastoid air cells  are clear. MRA HEAD FINDINGS The right internal carotid artery is occluded. The left internal carotid artery is unremarkable from the high cervical segments through the ICA termini. The anterior communicating artery is patent. The left A1 and M1 segments are intact. The left MCA bifurcation is within normal limits. There is mild distal small vessel irregularity. There is slight decreased intensity of signal in the right A2 segments. There is significant attenuation of signal within the right A1 and M1 segments. That significantly affects the right MCA branch vessels as well. The left vertebral artery is the dominant vessel. There is a high-grade stenosis or occlusion of the right vertebral artery. The right AICA is dominant. The basilar artery is small. The right posterior cerebral artery originates from the basilar tip. The left posterior cerebral artery is of fetal type. Better signal is present in the right than left PCA branch vessels.  MRA NECK FINDINGS Time-of-flight images demonstrate signal loss compatible with a high-grade stenosis at the left carotid bifurcation. The right internal carotid artery is occluded just beyond the bifurcation. Flow is antegrade in the left vertebral artery. The right vertebral artery is not visualized. The postcontrast images demonstrate a moderate stenosis at the origin of the left common carotid artery. There is a high-grade stenosis of the distal right common carotid artery. The right internal carotid artery is occluded 8 mm beyond the bifurcation. There is signal loss compatible with a high-grade stenosis in the proximal left internal and external carotid arteries. No distal stenoses are present. There signal loss at the origin of the left vertebra artery suggesting moderate to high-grade stenosis. Mild tortuosity is present in the cervical left vertebral artery. There is no significant stenosis in the neck or intracranial. The right vertebral artery is hypoplastic with segmental narrowing beginning at the dural margin and inclusion proximal to the vertebrobasilar junction. IMPRESSION: 1. Bilateral watershed territory infarct, more prominent left than right. 2. 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 3. Attenuated flow in the right M1 segment with flow to the right via a patent anterior communicating artery comment from the left. 4. Asymmetric attenuation of flow into the right MCA branch vessels. 5. High-grade stenosis of the proximal left internal carotid artery. The left internal carotid artery feeds the entire anterior circulation and the left posterior circulation. 6. Moderate to high-grade proximal left vertebral artery stenosis. 7. High-grade stenosis and occlusion of the hypoplastic distal right vertebral artery. The right vertebral artery is not well seen in the neck and likely occluded proximally and reconstituted distally. 8. Fetal type left  posterior cerebral artery. 9. Additional atrophy and white matter changes are noted in the brain. Electronically Signed   By: San Morelle M.D.   On: 06/01/2015 18:04   Mr Brain Wo Contrast  06/01/2015  CLINICAL DATA:  Progressive weakness in the right arm over the last week. Unable but PEG up coffee this morning. Antrum and headache. Hypertension. Carotid bruits. EXAM: MRI HEAD WITHOUT AND WITH CONTRAST MRA HEAD WITHOUT CONTRAST MRA NECK WITHOUT AND WITH CONTRAST TECHNIQUE: Multiplanar, multiecho pulse sequences of the brain and surrounding structures were obtained without and with intravenous contrast. Angiographic images of the Circle of Willis were obtained using MRA technique without intravenous contrast. Angiographic images of the neck were obtained using MRA technique without and with intravenous contrast. Carotid stenosis measurements (when applicable) are obtained utilizing NASCET criteria, using the distal internal carotid diameter as the denominator. CONTRAST:  53mL MULTIHANCE GADOBENATE  DIMEGLUMINE 529 MG/ML IV SOLN COMPARISON:  CT head from the same day. FINDINGS: MRI HEAD FINDINGS The diffusion-weighted images confirm scattered foci of acute/ subacute infarct and both cerebral hemispheres. These cross a watershed distribution and a more prominent left than right. Anteriorly and superiorly the infarcts are similar on both sides. The areas of infarction extend more posteriorly on the left extending to the border of the left temporal and occipital lobes. Cortical and subcortical T2 hyperintensities correspond with the areas of restricted diffusion. Mild atrophy and periventricular white matter changes are noted bilaterally as well. T2 changes extend along the cortical spinal tract on the right, likely related to remote ischemic injury. T2 changes extend into the brainstem. There is no flow in the right internal carotid artery at the skullbase. Flow is present in the posterior circulation. Flow  is present in the left internal carotid artery and bilateral MCA vessels. No significant extra-axial fluid collection is present. Bilateral lens replacements are noted. The globes and orbits are otherwise within normal limits bilaterally. Mild mucosal thickening is evident throughout the paranasal sinuses minimal fluid is present in the left maxillary sinus and right sphenoid sinus. There is more pronounced mucosal thickening in the left frontal sinus. The mastoid air cells are clear. MRA HEAD FINDINGS The right internal carotid artery is occluded. The left internal carotid artery is unremarkable from the high cervical segments through the ICA termini. The anterior communicating artery is patent. The left A1 and M1 segments are intact. The left MCA bifurcation is within normal limits. There is mild distal small vessel irregularity. There is slight decreased intensity of signal in the right A2 segments. There is significant attenuation of signal within the right A1 and M1 segments. That significantly affects the right MCA branch vessels as well. The left vertebral artery is the dominant vessel. There is a high-grade stenosis or occlusion of the right vertebral artery. The right AICA is dominant. The basilar artery is small. The right posterior cerebral artery originates from the basilar tip. The left posterior cerebral artery is of fetal type. Better signal is present in the right than left PCA branch vessels. MRA NECK FINDINGS Time-of-flight images demonstrate signal loss compatible with a high-grade stenosis at the left carotid bifurcation. The right internal carotid artery is occluded just beyond the bifurcation. Flow is antegrade in the left vertebral artery. The right vertebral artery is not visualized. The postcontrast images demonstrate a moderate stenosis at the origin of the left common carotid artery. There is a high-grade stenosis of the distal right common carotid artery. The right internal carotid artery  is occluded 8 mm beyond the bifurcation. There is signal loss compatible with a high-grade stenosis in the proximal left internal and external carotid arteries. No distal stenoses are present. There signal loss at the origin of the left vertebra artery suggesting moderate to high-grade stenosis. Mild tortuosity is present in the cervical left vertebral artery. There is no significant stenosis in the neck or intracranial. The right vertebral artery is hypoplastic with segmental narrowing beginning at the dural margin and inclusion proximal to the vertebrobasilar junction. IMPRESSION: 1. Bilateral watershed territory infarct, more prominent left than right. 2. 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 3. Attenuated flow in the right M1 segment with flow to the right via a patent anterior communicating artery comment from the left. 4. Asymmetric attenuation of flow into the right MCA branch vessels. 5. High-grade stenosis of the proximal left internal  carotid artery. The left internal carotid artery feeds the entire anterior circulation and the left posterior circulation. 6. Moderate to high-grade proximal left vertebral artery stenosis. 7. High-grade stenosis and occlusion of the hypoplastic distal right vertebral artery. The right vertebral artery is not well seen in the neck and likely occluded proximally and reconstituted distally. 8. Fetal type left posterior cerebral artery. 9. Additional atrophy and white matter changes are noted in the brain. Electronically Signed   By: San Morelle M.D.   On: 06/01/2015 18:04    CBC  Recent Labs Lab 06/01/15 1243 06/01/15 2147 06/02/15 0346  WBC 8.3 7.4 6.9  HGB 13.3 12.0* 12.6*  HCT 39.4 36.5* 38.7*  PLT 316 284 294  MCV 91.0 91.5 92.1  MCH 30.7 30.1 30.0  MCHC 33.8 32.9 32.6  RDW 12.5 12.6 12.7  LYMPHSABS 1.9  --   --   MONOABS 1.2*  --   --   EOSABS 0.2  --   --   BASOSABS 0.1  --   --      Chemistries   Recent Labs Lab 06/01/15 1243 06/01/15 2147 06/02/15 0346  NA 131*  --  138  K 4.1  --  4.5  CL 98*  --  102  CO2 23  --  29  GLUCOSE 108*  --  94  BUN 9  --  7  CREATININE 1.10 1.06 1.05  CALCIUM 9.3  --  9.5  MG  --  2.0  --   AST 23  --  18  ALT 17  --  15*  ALKPHOS 54  --  48  BILITOT 0.5  --  0.4   ------------------------------------------------------------------------------------------------------------------ estimated creatinine clearance is 71.1 mL/min (by C-G formula based on Cr of 1.05). ------------------------------------------------------------------------------------------------------------------  Recent Labs  06/01/15 1842  HGBA1C 5.6   ------------------------------------------------------------------------------------------------------------------  Recent Labs  06/02/15 0346  CHOL 183  HDL 51  LDLCALC 112*  TRIG 99  CHOLHDL 3.6   ------------------------------------------------------------------------------------------------------------------ No results for input(s): TSH, T4TOTAL, T3FREE, THYROIDAB in the last 72 hours.  Invalid input(s): FREET3 ------------------------------------------------------------------------------------------------------------------ No results for input(s): VITAMINB12, FOLATE, FERRITIN, TIBC, IRON, RETICCTPCT in the last 72 hours.  Coagulation profile  Recent Labs Lab 06/01/15 1243  INR 1.00    No results for input(s): DDIMER in the last 72 hours.  Cardiac Enzymes  Recent Labs Lab 06/01/15 2147  TROPONINI <0.03   ------------------------------------------------------------------------------------------------------------------ Invalid input(s): POCBNP    Phil Michels D.O. on 06/02/2015 at 2:09 PM  Between 7am to 7pm - Pager - [phone]  After 7pm go to [email or web address] - password [email or web address]  And look for the night coverage person covering for me after hours  [facility]  Group Office  [phone]

## 2015-06-02 NOTE — Care Management Note (Signed)
Case Management Note  Patient Details  Name: Connor Butler MRN: [id number] Date of Birth: 1951-07-30  Subjective/Objective:                    Action/Plan: Patient was admitted with CVA. Lives at home alone. Will follow for discharge needs pending PT/OT evals and physician orders.  Expected Discharge Date:                  Expected Discharge Plan:  Home/Self Care  In-House Referral:     Discharge planning Services     Post Acute Care Choice:    Choice offered to:     DME Arranged:    DME Agency:     HH Arranged:    HH Agency:     Status of Service:  In process, will continue to follow  Medicare Important Message Given:    Date Medicare IM Given:    Medicare IM give by:    Date Additional Medicare IM Given:    Additional Medicare Important Message give by:     If discussed at [place] of Stay Meetings, dates discussed:    Additional Comments:  Rolm Baptise, RN 06/02/2015, 11:18 AM

## 2015-06-02 NOTE — Progress Notes (Signed)
STROKE TEAM PROGRESS NOTE   HISTORY Connor Butler is an 64 y.o. male with a past medical history significant for HTN, right carotid artery occlusion and "nearly occluded left carotid" as per patient, heavy smoker, alcohol abuse, comes in for evaluation of new onset right arm weakness.  Patient is right handed and said that the first thing that he notice one week ago was that he was losing her dexterity to write. Shortly thereafter, the right arm started becoming obviously weak and reports that today had gotten worse to the point where this morning it was difficult to pick up his coffee cup. Denies weakness of the right LE " but my right leg is getting kind of clumsy". No weakness in the left hemibody. Mild HA, but no vertigo, double vision, difficulty swallowing, slurred speech, imbalance, language or vision impairment. CT brain was personally reviewed and showed subcentimeter, subacute to chronic infarcts in the high frontal lobes. However, MRI brain revealed multiple areas of acute and subacute ischemia involving both cerebral hemispheres cortically and sub cortically in a watershed pattern, chiefly over the right brain. The areas of infarction extend more posteriorly on the left extending to the border of the left temporal and occipital lobes. MRA neck and brain : high-grade stenosis of the proximal left internal carotid artery. 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 Attenuated flow in the right M1 segment with flow to the right via a patent anterior communicating artery comment from the left.Asymmetric attenuation of flow into the right MCA branch vessels.   Patient was not administered TPA secondary to unknown last known well. He was admitted for further evaluation and treatment.   SUBJECTIVE (INTERVAL HISTORY) His son is at the bedside.  Overall he feels his condition is stable. Patient concerns with the stroke and wants to get  better.   OBJECTIVE Temp:  [97.7 F (36.5 C)-98.8 F (37.1 C)] 98.1 F (36.7 C) (10/20 1017) Pulse Rate:  [60-92] 77 (10/20 1017) Cardiac Rhythm:  [-] Normal sinus rhythm (10/20 0700) Resp:  [14-20] 18 (10/20 1017) BP: (108-157)/(50-93) 146/75 mmHg (10/20 1017) SpO2:  [93 %-100 %] 100 % (10/20 1017)  CBC:   Recent Labs Lab 06/01/15 1243 06/01/15 2147 06/02/15 0346  WBC 8.3 7.4 6.9  NEUTROABS 4.9  --   --   HGB 13.3 12.0* 12.6*  HCT 39.4 36.5* 38.7*  MCV 91.0 91.5 92.1  PLT 316 284 124    Basic Metabolic Panel:   Recent Labs Lab 06/01/15 1243 06/01/15 2147 06/02/15 0346  NA 131*  --  138  K 4.1  --  4.5  CL 98*  --  102  CO2 23  --  29  GLUCOSE 108*  --  94  BUN 9  --  7  CREATININE 1.10 1.06 1.05  CALCIUM 9.3  --  9.5  MG  --  2.0  --   PHOS  --  2.6  --     Lipid Panel:     Component Value Date/Time   CHOL 183 06/02/2015 0346   TRIG 99 06/02/2015 0346   HDL 51 06/02/2015 0346   CHOLHDL 3.6 06/02/2015 0346   VLDL 20 06/02/2015 0346   LDLCALC 112* 06/02/2015 0346   HgbA1c:  Lab Results  Component Value Date   HGBA1C 5.6 06/01/2015   Urine Drug Screen:     Component Value Date/Time   LABOPIA NONE DETECTED 06/01/2015 1315   COCAINSCRNUR NONE DETECTED 06/01/2015 1315  LABBENZ NONE DETECTED 06/01/2015 1315   AMPHETMU NONE DETECTED 06/01/2015 1315   THCU POSITIVE* 06/01/2015 1315   LABBARB NONE DETECTED 06/01/2015 1315      IMAGING  Dg Chest 2 View 06/01/2015   COPD changes. No acute abnormalities.   Ct Head Wo Contrast 06/01/2015   1. No evidence of large territory infarct or intracranial hemorrhage. No mass effect. 2. Mild chronic small vessel ischemic disease in the cerebral white matter. 3. Subcentimeter, subacute to chronic infarcts in the high frontal lobes.   Mri head, Mra head and neck Wo Contrast 06/01/2015  1. Bilateral watershed territory infarct, more prominent left than right. 2. Occlusion of the right internal carotid artery  approximately 8 mm from the carotid bifurcation. 3. Attenuated flow in the right M1 segment with flow to the right via a patent anterior communicating artery comment from the left. 4. Asymmetric attenuation of flow into the right MCA branch vessels. 5. High-grade stenosis of the proximal left internal carotid artery. The left internal carotid artery feeds the entire anterior circulation and the left posterior circulation. 6. Moderate to high-grade proximal left vertebral artery stenosis. 7. High-grade stenosis and occlusion of the hypoplastic distal right vertebral artery. The right vertebral artery is not well seen in the neck and likely occluded proximally and reconstituted distally. 8. Fetal type left posterior cerebral artery. 9. Additional atrophy and white matter changes are noted in the brain.   Carotid Doppler   Findings suggest >50% right common carotid artery stenosis. The right internal carotid artery exhibits string sign with distal tardus parvus waveforms, suggestive of near occlusion. The right external carotid artery exhibits low resistant flow, suggestive of compensatory flow. 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  The left external carotid artery exhibits elevated velocities suggestive of >50% stenosis. The left internal carotid artery exhibits elevated velocities suggestive of 80-99% stenosis. The left vertebral artery is patent with antegrade flow.  2D Echocardiogram   - Left ventricle: The cavity size was normal. Systolic function wasnormal. The estimated ejection fraction was in the range of 55%to 60%. Wall motion was normal; there were no regional wallmotion abnormalities. - Atrial septum: No defect or patent foramen ovale was identified.   PHYSICAL EXAM  pleasant middle-age Caucasian male currently not in distress. . Afebrile. Head is nontraumatic. Neck is supple without bruit.    Cardiac exam no murmur or gallop. Lungs are clear to auscultation. Distal pulses are  well felt. Neurological Exam :  Awake alert oriented 3 with normal speech and language function. Extraocular movements are full range without nystagmus. Fundi were not visualized. Vision acuity seems adequate. Mild right lower facial asymmetry. Tongue is midline. Motor system exam reveals no upper or lower extremity drift. Mild weakness of the right grip and intrinsic hand muscles. No lower extremity weakness. Sensation is intact bilaterally. Deep tendon reflexes are symmetric. Plantars are downgoing. Gait was not tested  ASSESSMENT/PLAN Mr. LOMAN LOGAN is a 64 y.o. male with history of HTN, right carotid artery occlusion and "nearly occluded left carotid" as per patient, heavy smoker and alcohol abuse presenting with right arm weakness. He did not receive IV t-PA due to delay in arrival.   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 Suspect chronic left ICA occlusion   MRI  L>R watershed infarcts. Atrophy. WMD  MRA  Occlusion of right ICA. Decreased flow right M1. High-grade stenosis left ICA high-grade  stenosis left and right vertebral arteries.  Carotid Doppler  Greater than 50% right CCA, near occlusion right ICA with compensatory flow, right vertebral artery occluded. Left ECA greater than 50% stenosis. Left ICA 80-99% stenosis.  2D Echo  No source of embolus   LDL 112  HgbA1c 5.6  Lovenox 40 mg sq daily for VTE prophylaxis Diet Heart Room service appropriate?: Yes; Fluid consistency:: Thin  No antithrombotic prior to admission, now on aspirin 81 mg daily. Consider the addition of plavix pending VVS decision  Patient counseled to be compliant with his antithrombotic medications  Ongoing aggressive stroke risk factor management  Therapy recommendations:  Supervision. Physical therapy pending.  Disposition:  pending   Carotid stenosis, bilateral  Right ICA with string  sign  Left ICA 80-99% stenosis  VVS consult  Hypertension  Stable Permissive hypertension (OK if < 220/120) but gradually normalize in 5-7 days Ok to Keep blood pressure elevated given limited intracranial circulation  Hyperlipidemia  Home meds:  No statin   LDL 112, goal < 70  Added Lipitor 10 mg daily  Continue statin at discharge  Other Stroke Risk Factors  Cigarette smoker, agreeable to stop smoking  ETOH use  Family hx stroke (father)  Hospital day # [place] [facility] for Pager information 06/02/2015 4:50 PM  I have personally examined this patient, reviewed notes, independently viewed imaging studies, participated in medical decision making and plan of care. I have made any additions or clarifications directly to the above note. Agree with note above. Patient has presented with right upper extremity and face weakness secondary to bilateral left greater than right watershed infarcts secondary to known right extracranial carotid occlusion and high-grade left extracranial carotid stenosis which is symptomatic. He remains at risk for recurrent stroke, neurological worsening, TIAs and needs ongoing evaluation and the emergent revascularization.I had a long discussion with the patient and son at the bedside regarding his significant risk for strokes and need for emergent revascularization and counseled him to quit smoking and answered questions  I have spoken to the medical hospitalist as well as vascular surgeon Dr. Trula Slade and recommend start aspirin and Plavix and consider early revascularization of the left carotid  Antony Contras, [place] Pager: [phone] 06/02/2015 5:14 PM    To contact Stroke Continuity provider, please refer to [email or web address] After hours, contact General Neurology

## 2015-06-02 NOTE — Consult Note (Signed)
Hospital Consult    Reason for Consult:  Stroke with carotid artery stenosis Referring Physician:  Armida Sans MRN #:  [id number]  History of Present Illness: This is a 64 y.o. male who has been seen by Dr. Donnetta Hutching in the past for carotid artery stenosis.  He was last seen in December 2015 and at that time, it was noted that previous duplex suggested right ICA occlusion.  The duplex in the office suggested a high grade stenosis and a CTA of the neck was ordered.  This revealed complete versus near complete occlusion of the proximal right ICA 1cm beyond its origin with minimal distal flow.  There was extensive atherosclerosis at the left carotid bifurcation with severe left ECA origin stenosis and 55% left ICA stenosis.  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   Given those results, he was to return in 1 year for repeat duplex given the left was only 55% stenosed.    He presented to the hospital yesterday with complaints of right arm weakness for about a week.  He states that his right arm is clumbsy.  He denies any vision changes, right leg weakness, speech difficulties or left sided weakness.    He continues to smoke ~ 1 ppd of cigarettes and states he drinks 4-5 beers/day.    He takes an ACEI for hypertension.  He was not on an aspirin, but has been placed on daily aspirin during admission.    Past Medical History  Diagnosis Date  . Hypertension   . Carotid artery occlusion   . Arthritis     Past Surgical History  Procedure Laterality Date  . Hernia repair  2003    No Known Allergies  Prior to Admission medications   Medication Sig Start Date End Date Taking? Authorizing Provider  lisinopril (PRINIVIL,ZESTRIL) 10 MG tablet Take 10 mg by mouth daily.   Yes Historical Provider, MD  Naproxen Sodium (ALEVE) 220 MG CAPS Take 440 mg by mouth daily as needed (general pain).   Yes Historical Provider, MD    Social History   Social History  . Marital Status: Legally Separated     Spouse Name: N/A  . Number of Children: N/A  . Years of Education: N/A   Occupational History  . Not on file.   Social History Main Topics  . Smoking status: Current Every Day Smoker -- 1.00 packs/day for 45 years    Types: Cigarettes  . Smokeless tobacco: Never Used  . Alcohol Use: 25.2 oz/week    42 Cans of beer per week  . Drug Use: No  . Sexual Activity: Not on file   Other Topics Concern  . Not on file   Social History Narrative     Family History  Problem Relation Age of Onset  . Stroke Father     ROS: [x]  Positive   [ ]  Negative   [ ]  All sytems reviewed and are negative  Cardiovascular: []  chest pain/pressure []  palpitations []  SOB lying flat [x]  DOE []  pain in legs while walking []  pain in legs at rest []  pain in legs at night []  non-healing ulcers []  hx of DVT []  swelling in legs  Pulmonary: []  productive cough []  asthma/wheezing []  home O2  Neurologic: [x]  right arm weakness  []  numbness in []  arms []  legs [x]  hx of CVA []  mini stroke [] difficulty speaking or slurred speech []  temporary loss of vision in one eye []  dizziness  Hematologic: []  hx of cancer []  bleeding problems []   problems with blood clotting easily  Endocrine:   []  diabetes []  thyroid disease  GI []  vomiting blood []  blood in stool  GU: []  CKD/renal failure []  HD--[]  M/W/F or []  T/T/S []  burning with urination []  blood in urine  Psychiatric: []  anxiety []  depression  Musculoskeletal: [x]  arthritis []  joint pain  Integumentary: []  rashes []  ulcers  Constitutional: []  fever []  chills   Physical Examination  Filed Vitals:   06/02/15 1017  BP: 146/75  Pulse: 77  Temp: 98.1 F (36.7 C)  Resp: 18   Body mass index is 26.72 kg/(m^2).  General:  WDWN in NAD Gait: Not observed HENT: WNL, normocephalic Pulmonary: normal non-labored breathing, without Rales, rhonchi,  wheezing Cardiac: regular, without  Murmurs, rubs or gallops; with left carotid  bruit Abdomen:  soft, NT/ND, no masses Skin: without rashes Vascular Exam/Pulses:  Right Left  Radial 2+ (normal) 2+ (normal)  Ulnar Unable to palpate  Unable to palpate   Femoral 2+ (normal) 2+ (normal)  Popliteal Unable to palpate  Unable to palpate   DP 2+ (normal) 2+ (normal)  PT Unable to palpate  Unable to palpate    Extremities: without ischemic changes, without Gangrene , without cellulitis; without open wounds;  Musculoskeletal: no muscle wasting or atrophy  Neurologic: A&O X 3; Appropriate Affect ; SENSATION: normal; MOTOR FUNCTION:  moving all extremities equally. Speech is fluent/normal Psychiatric:  Appears capable of medical decision making Lymph:  No inguinal lymphadenopathy    CBC    Component Value Date/Time   WBC 6.9 06/02/2015 0346   RBC 4.20* 06/02/2015 0346   HGB 12.6* 06/02/2015 0346   HCT 38.7* 06/02/2015 0346   PLT 294 06/02/2015 0346   MCV 92.1 06/02/2015 0346   MCH 30.0 06/02/2015 0346   MCHC 32.6 06/02/2015 0346   RDW 12.7 06/02/2015 0346   LYMPHSABS 1.9 06/01/2015 1243   MONOABS 1.2* 06/01/2015 1243   EOSABS 0.2 06/01/2015 1243   BASOSABS 0.1 06/01/2015 1243    BMET    Component Value Date/Time   NA 138 06/02/2015 0346   K 4.5 06/02/2015 0346   CL 102 06/02/2015 0346   CO2 29 06/02/2015 0346   GLUCOSE 94 06/02/2015 0346   BUN 7 06/02/2015 0346   CREATININE 1.05 06/02/2015 0346   CREATININE 1.30 07/20/2014 1127   CALCIUM 9.5 06/02/2015 0346   GFRNONAA >60 06/02/2015 0346   GFRAA >60 06/02/2015 0346    COAGS: Lab Results  Component Value Date   INR 1.00 06/01/2015    Non-Invasive Vascular Imaging:   CTA 07/22/14: IMPRESSION: 1. Complete versus near complete occlusion of the proximal right ICA 1 cm beyond its origin with minimal distal flow. 2. 80% stenosis of the mid right common carotid artery. 3. Extensive atherosclerosis at the left carotid bifurcation with severe left ECA origin stenosis and 55% left ICA  stenosis. 4. Occluded right vertebral artery. 5. Patent left vertebral artery. 6. Sphenoid and maxillary sinus fluid, correlate clinically for acute sinusitis.  MRA 06/01/15: IMPRESSION: 1. Bilateral watershed territory infarct, more prominent left than right. 2. 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 3. Attenuated flow in the right M1 segment with flow to the right via a patent anterior communicating artery comment from the left. 4. Asymmetric attenuation of flow into the right MCA branch vessels. 5. High-grade stenosis of the proximal left internal carotid artery. The left internal carotid artery feeds the entire anterior circulation and the left posterior circulation.  6. Moderate to high-grade proximal left vertebral artery stenosis. 7. High-grade stenosis and occlusion of the hypoplastic distal right vertebral artery. The right vertebral artery is not well seen in the neck and likely occluded proximally and reconstituted distally. 8. Fetal type left posterior cerebral artery. 9. Additional atrophy and white matter changes are noted in the Brain.  Carotid duplex 06/02/15: Findings suggest >50% right common carotid artery stenosis. The right internal carotid artery exhibits string sign with distal tardus parvus waveforms, suggestive of near occlusion. The right external carotid artery exhibits low resistant flow, suggestive of compensatory flow. 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  The left external carotid artery exhibits elevated velocities suggestive of >50% stenosis. The left internal carotid artery exhibits elevated velocities suggestive of 80-99% stenosis. The left vertebral artery is patent with antegrade flow.  Statin:  No. Beta Blocker:  No. Aspirin:  Yes.   ACEI:  Yes ARB:  No. Other antiplatelets/anticoagulants:  Yes.   (Lovenox for DVT prophylaxis)   ASSESSMENT/PLAN: This is a 64 y.o. male  with symptomatic high grade left carotid artery stenosis   -pt has occluded right ICA and high grade left carotid artery stenosis by MRA and duplex -pt will need left carotid endarterectomy-timing to be decided, but most likely next Wednesday, June 08, 2015 -Dr. Trula Slade will be in to see pt this afternoon -encourage smoking cessation -consider statin -pt on CIWA protocol   Leontine Locket, PA-C [facility] [phone]  I agree with the above.  I have seen and evaluated the patient.  He has a history of carotid stenosis.  He was seen by Dr. early in 2015.  At that time he had an ultrasound and a CT scan which confirmed right ICA occlusion.  The left side had approximately 55% stenosis.  The patient came into the hospital with right arm weakness.  MRI has confirmed bilateral strokes as well as vertebral artery stenosis.  The carotid stenosis on the left has increased to greater than 80% by ultrasound.  The patient needs to undergo carotid revascularization.  We discussed proceeding with left carotid endarterectomy by Dr. early.  I discussed the details of the procedure including the risk of nerve injury, stroke, and bleeding as well as numbness around the incision extending up onto the mandible.  He can be placed on dual agent antiplatelet therapy.  His surgery will be scheduled for next week, likely on Wednesday with Dr. early.  Annamarie Major

## 2015-06-02 NOTE — Progress Notes (Signed)
Initial Nutrition Assessment  DOCUMENTATION CODES:   Not applicable  INTERVENTION:  Boost Breeze po TID, each supplement provides 250 kcal and 9 grams of protein  NUTRITION DIAGNOSIS:   Inadequate oral intake related to poor appetite as evidenced by per patient/family report.  GOAL:   Patient will meet greater than or equal to 90% of their needs  MONITOR:   PO intake, Skin, I & O's, Labs  REASON FOR ASSESSMENT:   Malnutrition Screening Tool    ASSESSMENT:  Connor Butler is a 64 y.o. male with a past medical history of alcohol abuse, 2 pack per day cigarette smoker, hypertension, bilateral carotid artery disease (per patient right side is occluded and left side is nearly occluded) who is coming with a week history of right sided extremities weakness. Per patient, he noticed about a week ago that he was having some difficulty with his handwriting and handling things. This continued to happen in a very slow wave, but this morning the patient had significant difficulty picking up a couple coffee and also noticed that his right lower extremity was clumsy  Pt has had multiple subacute infarctions, likely leading to weakness. Spoke to pt with family at bedside. Reports he hasn't been eating well, just doesn't have the same appetite he used to, but does eat heavily in spurts. PMH of Alcohol Abuse may contribute to this. Does not appear malnourished based on NFPA. Pt doesn't drink any supplements,. Will provide BBx3 to provide extra calories during stay.   Diet Order:  Diet Heart Room service appropriate?: Yes; Fluid consistency:: Thin  Skin:  Reviewed, no issues  Last BM:  06/01/2015  Height:   Ht Readings from Last 1 Encounters:  06/01/15 5\' 9"  (1.753 m)    Weight:   Wt Readings from Last 1 Encounters:  06/01/15 181 lb (82.101 kg)    Ideal Body Weight:  72.72 kg  BMI:  Body mass index is 26.72 kg/(m^2).  Estimated Nutritional Needs:   Kcal:  1800-2050  calories  Protein:  65-80 grams  Fluid:  >/= 1.8 - 2L  EDUCATION NEEDS:   No education needs identified at this time  Satira Anis. Mckenzy Salazar, MS, RD LDN After Hours/Weekend Pager [phone]

## 2015-06-02 NOTE — Progress Notes (Signed)
Pt arrived from ED to room 5M12.  Pt is alert and oriented.  Family is at bedside.  Safety measures in place. Will continue to monitor.  Fredrich Romans, RN

## 2015-06-02 NOTE — Progress Notes (Signed)
Occupational Therapy Evaluation Patient Details Name: Connor Butler MRN: [id number] DOB: 1950/09/19 Today's Date: 06/02/2015    History of Present Illness Pt is a 64 y.o. male with a PMH of alcohol abuse, 2 pack per day cigarette smoker, hypertension, bilateral carotid artery disease (per patient right side is occluded and left side is nearly occluded) who is coming with a week history of right sided extremities weakness. CT brain was personally reviewed and showed subcentimeter, subacute to chronic infarcts in the high frontal lobes. However, MRI brain revealed multiple areas of acute and subacute ischemia involving both cerebral hemispheres cortically and sub cortically in a watershed pattern, chiefly over the right brain. The areas of infarction extend more posteriorly on the left extending to the border of the left temporal and occipital lobes.   Clinical Impression   Pt admitted with the above diagnoses and presents with below problem list. Pt will benefit from continued acute OT to address the below listed deficits and maximize independence with BADLs prior to d/c home. PTA pt was independent with ADLs. Pt is currently min guard for LB ADLs and functional mobility. Pt presents with decreased RUE fine motor coordination with dysmetria noted, also some decreased dynamic standing balance. OT to continue to follow acutely.      Follow Up Recommendations  Supervision - Intermittent;Other (comment);Home health OT (OOB/mobility)    Equipment Recommendations  None recommended by OT    Recommendations for Other Services PT consult     Precautions / Restrictions Restrictions Weight Bearing Restrictions: No      Mobility Bed Mobility Overal bed mobility: Needs Assistance Bed Mobility: Sit to Supine       Sit to supine: Supervision      Transfers Overall transfer level: Needs assistance Equipment used: None Transfers: Sit to/from Stand Sit to Stand: Min guard          General transfer comment: min guard for safety; from comfort height toilet and EOB    Balance Overall balance assessment: Needs assistance Sitting-balance support: No upper extremity supported;Feet supported Sitting balance-Leahy Scale: Good Sitting balance - Comments: donned/doffed socks in unsupported sitting   Standing balance support: No upper extremity supported;During functional activity Standing balance-Leahy Scale: Fair Standing balance comment: functional mobility at min guard level for safety with no AD or physical assist                            ADL Overall ADL's : Needs assistance/impaired Eating/Feeding: Set up;Sitting   Grooming: Oral care;Min guard;Standing   Upper Body Bathing: Set up;Sitting   Lower Body Bathing: Min guard;Sit to/from stand   Upper Body Dressing : Set up;Sitting   Lower Body Dressing: Min guard;Sit to/from stand   Toilet Transfer: Min guard;Comfort height toilet;Ambulation;Grab bars   Toileting- Clothing Manipulation and Hygiene: Min guard;Sit to/from stand   Tub/ Shower Transfer: Min guard;Ambulation;3 in 1   Functional mobility during ADLs: Min guard General ADL Comments: Min guard for safety     Vision Vision Assessment?: No apparent visual deficits Additional Comments: able to read therapist name badge and words on wall with no difficulty. denies bluriness and diplopia   Perception     Praxis      Pertinent Vitals/Pain Pain Assessment: No/denies pain     Hand Dominance Right   Extremity/Trunk Assessment Upper Extremity Assessment Upper Extremity Assessment: RUE deficits/detail RUE Deficits / Details: dysmetria noted; decreased fine motor  RUE Sensation: decreased proprioception RUE Coordination:  decreased fine motor   Lower Extremity Assessment Lower Extremity Assessment: Defer to PT evaluation       Communication Communication Communication: No difficulties   Cognition Arousal/Alertness:  Awake/alert Behavior During Therapy: WFL for tasks assessed/performed Overall Cognitive Status: Within Functional Limits for tasks assessed                     General Comments       Exercises       Shoulder Instructions      Home Living Family/patient expects to be discharged to:: Private residence Living Arrangements: Alone Available Help at Discharge: Family;Available PRN/intermittently Type of Home: Mobile home Home Access: Ramped entrance     Home Layout: One level     Bathroom Shower/Tub: Walk-in shower         Home Equipment: Cane - single point;Shower seat          Prior Functioning/Environment Level of Independence: Independent (occassional cane use with back pain)             OT Diagnosis: Other (comment);Paresis (decreased balance)   OT Problem List: Decreased strength;Impaired balance (sitting and/or standing);Decreased knowledge of use of DME or AE;Decreased knowledge of precautions;Impaired UE functional use   OT Treatment/Interventions: Self-care/ADL training;Therapeutic exercise;DME and/or AE instruction;Therapeutic activities;Patient/family education;Balance training    OT Goals(Current goals can be found in the care plan section) Acute Rehab OT Goals Patient Stated Goal: not stated OT Goal Formulation: With patient Time For Goal Achievement: 06/09/15 Potential to Achieve Goals: Good ADL Goals Pt Will Perform Grooming: with modified independence;standing Pt Will Perform Lower Body Dressing: with modified independence;sit to/from stand Pt Will Perform Tub/Shower Transfer: Shower transfer;with modified independence;ambulating;shower seat Pt/caregiver will Perform Home Exercise Program: Increased strength;Right Upper extremity;With theraputty;With written HEP provided  OT Frequency: Min 2X/week   Barriers to D/C:            Co-evaluation              End of Session Equipment Utilized During Treatment: Gait belt  Activity  Tolerance: Patient tolerated treatment well Patient left: in bed;with call bell/phone within reach;with bed alarm set   Time: 1245-1305 OT Time Calculation (min): 20 min Charges:  OT General Charges $OT Visit: 1 Procedure OT Evaluation $Initial OT Evaluation Tier I: 1 Procedure G-Codes:    Hortencia Pilar 06-10-2015, 1:19 PM

## 2015-06-02 NOTE — Progress Notes (Signed)
  Echocardiogram 2D Echocardiogram has been performed.  Connor Butler 06/02/2015, 9:32 AM

## 2015-06-02 NOTE — Evaluation (Signed)
Physical Therapy Evaluation and Discharge Patient Details Name: Connor Butler MRN: [id number] DOB: [birth year] Today's Date: 06/02/2015   History of Present Illness  Pt is a 64 y.o. male with a PMH of alcohol abuse, 2 pack per day cigarette smoker, hypertension, bilateral carotid artery disease (per patient right side is occluded and left side is nearly occluded) who is coming with a week history of right sided extremities weakness. CT brain was personally reviewed and showed subcentimeter, subacute to chronic infarcts in the high frontal lobes. However, MRI brain revealed multiple areas of acute and subacute ischemia involving both cerebral hemispheres cortically and sub cortically in a watershed pattern, chiefly over the right brain. The areas of infarction extend more posteriorly on the left extending to the border of the left temporal and occipital lobes.    Clinical Impression  Patient evaluated by Physical Therapy with no further acute PT needs identified. Patient is at his baseline, which is somewhat limited due to chronic back pain. He moves a bit slowly, however his balance is WNL. All education has been completed and the patient has no further questions. PT is signing off. Thank you for this referral.     Follow Up Recommendations No PT follow up    Equipment Recommendations  None recommended by PT    Recommendations for Other Services       Precautions / Restrictions        Mobility  Bed Mobility Overal bed mobility: Independent             General bed mobility comments: including managing linens  Transfers Overall transfer level: Independent Equipment used: None Transfers: Sit to/from Stand Sit to Stand: Independent            Ambulation/Gait Ambulation/Gait assistance: Modified independent (Device/Increase time) Ambulation Distance (Feet): 200 Feet Assistive device: None Gait Pattern/deviations: Step-through pattern;Decreased stride length (forward  head, no trunk rotation, limited UE swing) Gait velocity: slow (due to back pain) Gait velocity interpretation: Below normal speed for age/gender General Gait Details: reports baseline gait with minor deficits due to h/o chronic back pain  Stairs Stairs: Yes Stairs assistance: Independent Stair Management: No rails;Forwards Number of Stairs: 5    Wheelchair Mobility    Modified Rankin (Stroke Patients Only) Modified Rankin (Stroke Patients Only) Pre-Morbid Rankin Score: No significant disability Modified Rankin: No significant disability     Balance Overall balance assessment: Modified Independent Sitting-balance support: No upper extremity supported;Feet unsupported Sitting balance-Leahy Scale: Normal Sitting balance - Comments: balance and MMT at EOB with feet unsupported   Standing balance support: No upper extremity supported Standing balance-Leahy Scale: Normal                   Standardized Balance Assessment Standardized Balance Assessment : Berg Balance Test Berg Balance Test Sit to Stand: Able to stand without using hands and stabilize independently Standing Unsupported: Able to stand safely 2 minutes Sitting with Back Unsupported but Feet Supported on Floor or Stool: Able to sit safely and securely 2 minutes Stand to Sit: Sits safely with minimal use of hands Transfers: Able to transfer safely, minor use of hands Standing Unsupported with Eyes Closed: Able to stand 10 seconds safely Standing Ubsupported with Feet Together: Able to place feet together independently and stand 1 minute safely From Standing, Reach Forward with Outstretched Arm: Can reach forward >12 cm safely (5") Turn 360 Degrees: Able to turn 360 degrees safely in 4 seconds or less Standing Unsupported, Alternately Place Feet on  Step/Stool: Able to stand independently and complete 8 steps >20 seconds Standing Unsupported, One Foot in Front: Able to plae foot ahead of the other independently  and hold 30 seconds         Pertinent Vitals/Pain Pain Assessment: No/denies pain (chronic back pain; could not rate)    Home Living Family/patient expects to be discharged to:: Private residence Living Arrangements: Alone Available Help at Discharge: Family;Available PRN/intermittently Type of Home: Mobile home Home Access: Ramped entrance     Home Layout: One level Home Equipment: Cane - single point;Shower seat      Prior Function Level of Independence: Independent (occassional cane use with back pain)               Hand Dominance   Dominant Hand: Right    Extremity/Trunk Assessment   Upper Extremity Assessment: Defer to OT evaluation           Lower Extremity Assessment: Overall WFL for tasks assessed (strength, sensation WNL bil)      Cervical / Trunk Assessment: Other exceptions  Communication   Communication: No difficulties  Cognition Arousal/Alertness: Awake/alert Behavior During Therapy: WFL for tasks assessed/performed Overall Cognitive Status: Within Functional Limits for tasks assessed                      General Comments General comments (skin integrity, edema, etc.): some limitations due to back pain and are his baseline    Exercises        Assessment/Plan    PT Assessment Patent does not need any further PT services  PT Diagnosis Difficulty walking   PT Problem List    PT Treatment Interventions     PT Goals (Current goals can be found in the Care Plan section) Acute Rehab PT Goals Patient Stated Goal: go home before carotid surgery next Wed PT Goal Formulation: All assessment and education complete, DC therapy    Frequency     Barriers to discharge        Co-evaluation               End of Session Equipment Utilized During Treatment: Gait belt Activity Tolerance: Patient tolerated treatment well Patient left: in chair;with call bell/phone within reach Nurse Communication: Mobility status (signed off  transfer form for up I'ly)         Time: 1610-9604 PT Time Calculation (min) (ACUTE ONLY): 15 min   Charges:   PT Evaluation $Initial PT Evaluation Tier I: 1 Procedure     PT G Codes:        Suezette Lafave 06/18/15, 5:07 PM Pager [phone]

## 2015-06-02 NOTE — Progress Notes (Signed)
*  PRELIMINARY RESULTS* Vascular Ultrasound Carotid Duplex (Doppler) has been completed.   Findings suggest >50% right common carotid artery stenosis. The right internal carotid artery exhibits string sign with distal tardus parvus waveforms, suggestive of near occlusion. The right external carotid artery exhibits low resistant flow, suggestive of compensatory flow. 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  The left external carotid artery exhibits elevated velocities suggestive of >50% stenosis. The left internal carotid artery exhibits elevated velocities suggestive of 80-99% stenosis. The left vertebral artery is patent with antegrade flow.  Preliminary results discussed with Burnetta Sabin, NP.  06/02/2015 12:31 PM Maudry Mayhew, RVT, RDCS, RDMS

## 2015-06-03 ENCOUNTER — Encounter (HOSPITAL_COMMUNITY): Payer: Self-pay | Admitting: *Deleted

## 2015-06-03 ENCOUNTER — Other Ambulatory Visit: Payer: Self-pay | Admitting: *Deleted

## 2015-06-03 LAB — HEMOGLOBIN A1C
HEMOGLOBIN A1C: 5.6 % (ref 4.8–5.6)
MEAN PLASMA GLUCOSE: 114 mg/dL

## 2015-06-03 MED ORDER — ADULT MULTIVITAMIN W/MINERALS CH: 1.0000 | ORAL_TABLET | Freq: Every day | ORAL | Status: AC

## 2015-06-03 MED ORDER — NICOTINE 21 MG/24HR TD PT24: 21.0000 mg | MEDICATED_PATCH | Freq: Every day | TRANSDERMAL | Status: DC

## 2015-06-03 MED ORDER — ATORVASTATIN CALCIUM 10 MG PO TABS: 10.0000 mg | ORAL_TABLET | Freq: Every day | ORAL | Status: AC

## 2015-06-03 MED ORDER — ASPIRIN 81 MG PO TBEC: 81.0000 mg | DELAYED_RELEASE_TABLET | Freq: Every day | ORAL | Status: AC

## 2015-06-03 MED ORDER — THIAMINE HCL 100 MG PO TABS: 100.0000 mg | ORAL_TABLET | Freq: Every day | ORAL | Status: AC

## 2015-06-03 MED ORDER — CLOPIDOGREL BISULFATE 75 MG PO TABS
75.0000 mg | ORAL_TABLET | Freq: Every day | ORAL | Status: DC
Start: 2015-06-03 — End: 2015-06-03
  Filled 2015-06-03: qty 1

## 2015-06-03 MED ORDER — CLOPIDOGREL BISULFATE 75 MG PO TABS: 75.0000 mg | ORAL_TABLET | Freq: Every day | ORAL | Status: AC

## 2015-06-03 MED ORDER — FOLIC ACID 1 MG PO TABS: 1.0000 mg | ORAL_TABLET | Freq: Every day | ORAL | Status: AC

## 2015-06-03 MED ORDER — CLOPIDOGREL BISULFATE 75 MG PO TABS: 75.0000 mg | ORAL_TABLET | Freq: Every day | ORAL | Status: DC

## 2015-06-03 NOTE — Evaluation (Signed)
Speech Language Pathology Evaluation Patient Details Name: Connor Butler MRN: [id number] DOB: 1951-04-04 Today's Date: 06/03/2015 Time: 1413-1430 SLP Time Calculation (min) (ACUTE ONLY): 17 min  Problem List:  Patient Active Problem List   Diagnosis Date Noted  . CVA (cerebral infarction) 06/01/2015  . Acute CVA (cerebrovascular accident) ([place]) 06/01/2015  . Alcohol dependence ([place]) 06/01/2015  . Tobacco abuse disorder 06/01/2015  . Hyponatremia 06/01/2015  . Hypertension 06/01/2015  . Bilateral carotid artery stenosis 06/01/2015  . Cannabis abuse, episodic 06/01/2015  . Stroke with cerebral ischemia ([place])   . Occlusion and stenosis of carotid artery without mention of cerebral infarction 12/01/2013   Past Medical History:  Past Medical History  Diagnosis Date  . Hypertension   . Carotid artery occlusion   . Arthritis    Past Surgical History:  Past Surgical History  Procedure Laterality Date  . Hernia repair  2003   HPI:  Connor Butler is a 64 year old male with a past medical history of alcohol abuse, 2 pack per day cigarette smoker, hypertension, bilateral carotid artery disease who was admitted with a 1 week history of right sided extremities weakness. Per patient, he noticed about a week ago that he was having some difficulty with his handwriting and handling things. This continued to happen in a very slow wave, but this morning the patient had significant difficulty picking up a couple coffee and also noticed that his right lower extremity was clumsy. He has had a mild headache, but denies dizziness, vision changes, dysphagia, speech or language abnormalities.  MRI showed multiple areas of acute and subacute infarctions in both cerebral hemispheres cortically and so cortically in the watershed pattern, mostly on the right.     Assessment / Plan / Recommendation Clinical Impression  Cognitive-linguistic evaluation complete.  Patient with intact receptive and expressive  language abilities.  Speech is marked with a frontal lisp that patient reports to be his baseline.  Patient is fully oriented and able to recall his hospital course as well as current medical recommendations.  Patient reports being "simple" at baseline and that some days he does stuff around the house and other he doesn't.  Patient was able to safely problem solve given scenerios and required category cues x2 to achieve 5/5 accuracy during a delayed recall task, which he also confirmed was his baseline.  As a result, no skilled SLP services are warranted at this time; patient in agreement.  SLP signing off.     SLP Assessment  Patient does not need any further Speech Lanaguage Pathology Services    Follow Up Recommendations  None            Pertinent Vitals/Pain Pain Assessment: No/denies pain   SLP Goals  Progression toward goals:  (Eval ) Patient/Family Stated Goal: to go home   SLP Evaluation Prior Functioning  Cognitive/Linguistic Baseline: Baseline deficits Baseline deficit details: patient reports being "simple" at baseline with soem short term memory loss Type of Home: Mobile home  Lives With: Alone Available Help at Discharge: Family;Available PRN/intermittently   Cognition  Overall Cognitive Status: Within Functional Limits for tasks assessed Orientation Level: Oriented X4    Comprehension  Auditory Comprehension Overall Auditory Comprehension: Appears within functional limits for tasks assessed Visual Recognition/Discrimination Discrimination: Within Function Limits Reading Comprehension Reading Status: Not tested    Expression Expression Primary Mode of Expression: Verbal Verbal Expression Overall Verbal Expression: Appears within functional limits for tasks assessed Written Expression Dominant Hand: Right Written Expression: Not tested  Oral / Motor Oral Motor/Sensory Function Overall Oral Motor/Sensory Function: Appears within functional limits for tasks  assessed Motor Speech Overall Motor Speech: Appears within functional limits for tasks assessed (frontal lisp present at baseline)   [place], M.A., CCC-SLP [phone]  Claud Gowan 06/03/2015, 3:17 PM

## 2015-06-03 NOTE — Care Management Important Message (Signed)
Important Message  Patient Details  Name: Connor Butler MRN: [id number] Date of Birth: 26-Mar-1951   Medicare Important Message Given:  Yes-second notification given    Nathen May 06/03/2015, 12:15 PM

## 2015-06-03 NOTE — Progress Notes (Signed)
Occupational Therapy Treatment Patient Details Name: TANVIR HIPPLE MRN: [id number] DOB: 06-16-1951 Today's Date: 06/03/2015    History of present illness Pt is a 64 y.o. male with a PMH of alcohol abuse, 2 pack per day cigarette smoker, hypertension, bilateral carotid artery disease (per patient right side is occluded and left side is nearly occluded) who is coming with a week history of right sided extremities weakness. CT brain was personally reviewed and showed subcentimeter, subacute to chronic infarcts in the high frontal lobes. However, MRI brain revealed multiple areas of acute and subacute ischemia involving both cerebral hemispheres cortically and sub cortically in a watershed pattern, chiefly over the right brain. The areas of infarction extend more posteriorly on the left extending to the border of the left temporal and occipital lobes.   OT comments  Pt is functioning independently in ADL and mobility. No further OT needs. Pt eager to go home.  Follow Up Recommendations  No OT follow up    Equipment Recommendations  None recommended by OT    Recommendations for Other Services      Precautions / Restrictions Precautions Precautions: None Restrictions Weight Bearing Restrictions: No       Mobility Bed Mobility               General bed mobility comments: pt in chair  Transfers Overall transfer level: Independent Equipment used: None   Sit to Stand: Independent              Balance     Sitting balance-Leahy Scale: Normal       Standing balance-Leahy Scale: Normal                     ADL       Grooming: Wash/dry hands;Standing;Independent               Lower Body Dressing: Independent   Toilet Transfer: Ambulation;Regular Toilet;Independent   Toileting- Clothing Manipulation and Hygiene: Sit to/from stand;Independent   Tub/ Banker: Ambulation;Modified independent   Functional mobility during ADLs: Independent         Tourist information centre manager   Behavior During Therapy: Anxious Overall Cognitive Status: Within Functional Limits for tasks assessed                       Extremity/Trunk Assessment               Exercises     Shoulder Instructions       General Comments      Pertinent Vitals/ Pain       Pain Assessment: No/denies pain  Home Living                                          Prior Functioning/Environment              Frequency       Progress Toward Goals  OT Goals(current goals can now be found in the care plan section)  Progress towards OT goals: Goals met/education completed, patient discharged from OT  Acute Rehab OT Goals Patient Stated Goal: go home before carotid surgery next Wed  Plan Discharge plan needs to be updated  Co-evaluation                 End of Session Equipment Utilized During Treatment: Gait belt   Activity Tolerance Patient tolerated treatment well   Patient Left in chair;with call bell/phone within reach;with family/visitor present   Nurse Communication          Time: 7342-8768 OT Time Calculation (min): 19 min  Charges: OT General Charges $OT Visit: 1 Procedure OT Treatments $Therapeutic Activity: 8-22 mins  Malka So 06/03/2015, 11:56 AM [phone]

## 2015-06-03 NOTE — Progress Notes (Addendum)
Connor Butler denies chest pain.  Patient states he has shortness of breath, "but I was smoking up to 2 packs of cigarettes a day. hopefully that will get better."  Patient states that he has not smoked since 06/01/15, patient was having a beer' when I called. I reminded patient that he was instructed to not smoke cigarettes or marijuana, or drink alochol.     I notified Dr Deatra Canter of patients recent stroke and that he  is continuing Aspirin and Plavix and that HTN medications had been discounted for now.  No new orders were given.

## 2015-06-03 NOTE — Discharge Summary (Addendum)
Physician Discharge Summary  Connor Butler VPX:[id number] DOB: Nov 08, 1950 DOA: 06/01/2015  PCP: Simona Huh, MD  Admit date: 06/01/2015 Discharge date: 06/03/2015  Time spent: 45 minutes  Recommendations for Outpatient Follow-up:  Patient will be discharged to home.  Patient will need to follow up with primary care provider within one week of discharge.  Patient will be contacted by Dr. Donnetta Hutching, vascular surgery, for upcoming procedure. Follow up with neurology in 2 months.  Patient should continue medications as prescribed.  Patient should follow a heart healthy diet.  Patient advised to abstain from alcohol, nicotine, and marijuana use.   Discharge Diagnoses:  Acute CVA Alcohol dependence/polysubstance abuse Tobacco abuse disorder Hyponatremia Hypertension Bilateral carotid artery stenosis  Discharge Condition: Stable  Diet recommendation: heart healthy  Filed Weights   06/01/15 1218  Weight: 82.101 kg (181 lb)    History of present illness:  on 06/01/2015 by Dr. Tennis Must Connor Butler is a 64 y.o. male with a past medical history of alcohol abuse, 2 pack per day cigarette smoker, hypertension, bilateral carotid artery disease (per patient right side is occluded and left side is nearly occluded) who is coming with a week history of right sided extremities weakness. Per patient, he noticed about a week ago that he was having some difficulty with his handwriting and handling things. This continued to happen in a very slow wave, but this morning the patient had significant difficulty picking up a couple coffee and also noticed that his right lower extremity was clumsy. He has had a mild headache, but denies dizziness, vision changes, dysphagia, speech or language abnormalities.  Workup in the ER showed a negative CT scan of the brain, but follow-up MRI showed multiple areas of acute and subacute infarctions in both cerebral hemispheres cortically and so cortically in the  watershed pattern, mostly on the right. When seen in the ER the patient was in no acute distress.  Hospital Course:  Acute CVA -MRI of brain showed bilateral watershed territory infarct, more prominent on the left than right -MRA head and neck showed occlusion of the right ICA, high-grade stenosis of the left ICA, moderate to high-grade left vertebral artery stenosis, high-grade stenosis and occlusion of the hypoplastic distal right vertebral artery -Echocardiogram: EF of 55-60%, no PFO or ASD -Carotid Doppler pending -Neurology consulted and appreciated, recommended vascular surgery consult -Vascular surgery planning for left carotid endarterectomy by Dr. Donnetta Hutching next week -LDL 112, hemoglobin A1c 5.6 -PT and OT consulted and patient does not need further therapy -Continue aspirin, statin, plavix  Alcohol dependence/polysubstance abuse -was placed on CIWA protocol, multivitamin, thiamine, folic acid -Patient counseled -Currently no evidence of DTs -Toxicology screen positive for THC, patient counseled  Tobacco abuse disorder -Smoking cessation discussed -Continue nicotine patch  Hyponatremia -Resolved, sodium currently 138  Hypertension -Will hold lisinopril, will allow for permissive hypertension  Bilateral carotid artery stenosis -Vascular surgery consulted and appreciated  Procedures  Echocardiogram Carotid doppler  Consults  Neurology Vascular surgery  Discharge Exam: Filed Vitals:   06/03/15 1102  BP: 140/74  Pulse: 71  Temp: 98.3 F (36.8 C)  Resp: 20    Exam  General: Well developed, well nourished, NAD  HEENT: NCAT, mucous membranes moist.   Cardiovascular: S1 S2 auscultated, RRR.  Respiratory: Clear to auscultation   Abdomen: Soft, nontender, nondistended, + bowel sounds  Extremities: warm dry without cyanosis clubbing or edema  Neuro: AAOx3, nonfocal  Psych: Normal affect and demeanor, pleasant  Discharge Instructions  Discharge Instructions    Discharge instructions    Complete by:  As directed   Patient will be discharged to home.  Patient will need to follow up with primary care provider within one week of discharge.  Patient will be contacted by Dr. Donnetta Hutching, vascular surgery, for upcoming procedure. Follow up with neurology in 2 months.  Patient should continue medications as prescribed.  Patient should follow a heart healthy diet.  Patient advised to abstain from alcohol, nicotine, and marijuana use.            Medication List    STOP taking these medications        ALEVE 220 MG Caps  Generic drug:  Naproxen Sodium     lisinopril 10 MG tablet  Commonly known as:  PRINIVIL,ZESTRIL      TAKE these medications        aspirin 81 MG EC tablet  Take 1 tablet (81 mg total) by mouth daily.     atorvastatin 10 MG tablet  Commonly known as:  LIPITOR  Take 1 tablet (10 mg total) by mouth daily at 6 PM.     clopidogrel 75 MG tablet  Commonly known as:  PLAVIX  Take 1 tablet (75 mg total) by mouth daily.     clopidogrel 75 MG tablet  Commonly known as:  PLAVIX  Take 1 tablet (75 mg total) by mouth daily.     folic acid 1 MG tablet  Commonly known as:  FOLVITE  Take 1 tablet (1 mg total) by mouth daily.     multivitamin with minerals Tabs tablet  Take 1 tablet by mouth daily.     nicotine 21 mg/24hr patch  Commonly known as:  NICODERM CQ - dosed in mg/24 hours  Place 1 patch (21 mg total) onto the skin daily.     thiamine 100 MG tablet  Take 1 tablet (100 mg total) by mouth daily.       No Known Allergies Follow-up Information    Follow up with Simona Huh, MD. Schedule an appointment as soon as possible for a visit in 1 week.   Specialty:  Family Medicine   Why:  Hospital follow up   Contact information:   [address] [place] [address] [phone]       Follow up with SETHI,PRAMOD, MD. Schedule an appointment as soon as possible for a visit in 2  months.   Specialties:  Neurology, Radiology   Why:  Hospital follow up   Contact information:   [address] [place]  [address] [phone]        The results of significant diagnostics from this hospitalization (including imaging, microbiology, ancillary and laboratory) are listed below for reference.    Significant Diagnostic Studies: Dg Chest 2 View  06/01/2015  CLINICAL DATA:  Stroke symptoms, RIGHT-side numbness and weakness, hypertension, history carotid arterial occlusion EXAM: CHEST  2 VIEW COMPARISON:  None FINDINGS: Normal heart size, mediastinal contours, and pulmonary vascularity. Emphysematous changes without infiltrate, pleural effusion or pneumothorax. Bones unremarkable. IMPRESSION: COPD changes. No acute abnormalities. Electronically Signed   By: Lavonia Dana M.D.   On: 06/01/2015 21:08   Ct Head Wo Contrast  06/01/2015  CLINICAL DATA:  Right arm weakness for 1 week. Decreased grip strength. Dizziness. Possible stroke. EXAM: CT HEAD WITHOUT CONTRAST TECHNIQUE: Contiguous axial images were obtained from the base of the skull through the vertex without intravenous contrast. COMPARISON:  None. FINDINGS: There is no  evidence of acute large territory infarct, intracranial hemorrhage, mass, midline shift, or extra-axial fluid collection. Mild generalized cerebral atrophy is within normal limits for age. There is a subcentimeter subacute to chronic cortical infarct in the right precentral gyrus. There is a subcentimeter subacute to chronic subcortical white matter infarct in the high left frontal lobe. Periventricular white-matter hypodensities are nonspecific but compatible with mild chronic small vessel ischemic disease. Prior bilateral cataract extraction is noted. There is evidence of chronic left maxillary sinusitis, currently with minimal mucosal thickening in a small amount of secretions present. Trace fluid is present in the right sphenoid sinus, and there is  moderate left frontal sinus and mild bilateral ethmoid air cell mucosal thickening. Mastoid air cells are clear. Calcified atherosclerosis is noted at the skull base. IMPRESSION: 1. No evidence of large territory infarct or intracranial hemorrhage. No mass effect. 2. Mild chronic small vessel ischemic disease in the cerebral white matter. 3. Subcentimeter, subacute to chronic infarcts in the high frontal lobes. Electronically Signed   By: Logan Bores M.D.   On: 06/01/2015 14:20   Mr Jodene Nam Head Wo Contrast  06/01/2015  CLINICAL DATA:  Progressive weakness in the right arm over the last week. Unable but PEG up coffee this morning. Antrum and headache. Hypertension. Carotid bruits. EXAM: MRI HEAD WITHOUT AND WITH CONTRAST MRA HEAD WITHOUT CONTRAST MRA NECK WITHOUT AND WITH CONTRAST TECHNIQUE: Multiplanar, multiecho pulse sequences of the brain and surrounding structures were obtained without and with intravenous contrast. Angiographic images of the Circle of Willis were obtained using MRA technique without intravenous contrast. Angiographic images of the neck were obtained using MRA technique without and with intravenous contrast. Carotid stenosis measurements (when applicable) are obtained utilizing NASCET criteria, using the distal internal carotid diameter as the denominator. CONTRAST:  45mL MULTIHANCE GADOBENATE DIMEGLUMINE 529 MG/ML IV SOLN COMPARISON:  CT head from the same day. FINDINGS: MRI HEAD FINDINGS The diffusion-weighted images confirm scattered foci of acute/ subacute infarct and both cerebral hemispheres. These cross a watershed distribution and a more prominent left than right. Anteriorly and superiorly the infarcts are similar on both sides. The areas of infarction extend more posteriorly on the left extending to the border of the left temporal and occipital lobes. Cortical and subcortical T2 hyperintensities correspond with the areas of restricted diffusion. Mild atrophy and periventricular white  matter changes are noted bilaterally as well. T2 changes extend along the cortical spinal tract on the right, likely related to remote ischemic injury. T2 changes extend into the brainstem. There is no flow in the right internal carotid artery at the skullbase. Flow is present in the posterior circulation. Flow is present in the left internal carotid artery and bilateral MCA vessels. No significant extra-axial fluid collection is present. Bilateral lens replacements are noted. The globes and orbits are otherwise within normal limits bilaterally. Mild mucosal thickening is evident throughout the paranasal sinuses minimal fluid is present in the left maxillary sinus and right sphenoid sinus. There is more pronounced mucosal thickening in the left frontal sinus. The mastoid air cells are clear. MRA HEAD FINDINGS The right internal carotid artery is occluded. The left internal carotid artery is unremarkable from the high cervical segments through the ICA termini. The anterior communicating artery is patent. The left A1 and M1 segments are intact. The left MCA bifurcation is within normal limits. There is mild distal small vessel irregularity. There is slight decreased intensity of signal in the right A2 segments. There is significant attenuation of signal within  the right A1 and M1 segments. That significantly affects the right MCA branch vessels as well. The left vertebral artery is the dominant vessel. There is a high-grade stenosis or occlusion of the right vertebral artery. The right AICA is dominant. The basilar artery is small. The right posterior cerebral artery originates from the basilar tip. The left posterior cerebral artery is of fetal type. Better signal is present in the right than left PCA branch vessels. MRA NECK FINDINGS Time-of-flight images demonstrate signal loss compatible with a high-grade stenosis at the left carotid bifurcation. The right internal carotid artery is occluded just beyond the  bifurcation. Flow is antegrade in the left vertebral artery. The right vertebral artery is not visualized. The postcontrast images demonstrate a moderate stenosis at the origin of the left common carotid artery. There is a high-grade stenosis of the distal right common carotid artery. The right internal carotid artery is occluded 8 mm beyond the bifurcation. There is signal loss compatible with a high-grade stenosis in the proximal left internal and external carotid arteries. No distal stenoses are present. There signal loss at the origin of the left vertebra artery suggesting moderate to high-grade stenosis. Mild tortuosity is present in the cervical left vertebral artery. There is no significant stenosis in the neck or intracranial. The right vertebral artery is hypoplastic with segmental narrowing beginning at the dural margin and inclusion proximal to the vertebrobasilar junction. IMPRESSION: 1. Bilateral watershed territory infarct, more prominent left than right. 2. 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 3. Attenuated flow in the right M1 segment with flow to the right via a patent anterior communicating artery comment from the left. 4. Asymmetric attenuation of flow into the right MCA branch vessels. 5. High-grade stenosis of the proximal left internal carotid artery. The left internal carotid artery feeds the entire anterior circulation and the left posterior circulation. 6. Moderate to high-grade proximal left vertebral artery stenosis. 7. High-grade stenosis and occlusion of the hypoplastic distal right vertebral artery. The right vertebral artery is not well seen in the neck and likely occluded proximally and reconstituted distally. 8. Fetal type left posterior cerebral artery. 9. Additional atrophy and white matter changes are noted in the brain. Electronically Signed   By: San Morelle M.D.   On: 06/01/2015 18:04   Mr Angiogram Neck W Wo  Contrast  06/01/2015  CLINICAL DATA:  Progressive weakness in the right arm over the last week. Unable but PEG up coffee this morning. Antrum and headache. Hypertension. Carotid bruits. EXAM: MRI HEAD WITHOUT AND WITH CONTRAST MRA HEAD WITHOUT CONTRAST MRA NECK WITHOUT AND WITH CONTRAST TECHNIQUE: Multiplanar, multiecho pulse sequences of the brain and surrounding structures were obtained without and with intravenous contrast. Angiographic images of the Circle of Willis were obtained using MRA technique without intravenous contrast. Angiographic images of the neck were obtained using MRA technique without and with intravenous contrast. Carotid stenosis measurements (when applicable) are obtained utilizing NASCET criteria, using the distal internal carotid diameter as the denominator. CONTRAST:  70mL MULTIHANCE GADOBENATE DIMEGLUMINE 529 MG/ML IV SOLN COMPARISON:  CT head from the same day. FINDINGS: MRI HEAD FINDINGS The diffusion-weighted images confirm scattered foci of acute/ subacute infarct and both cerebral hemispheres. These cross a watershed distribution and a more prominent left than right. Anteriorly and superiorly the infarcts are similar on both sides. The areas of infarction extend more posteriorly on the left extending to the border of the left temporal and occipital lobes.  Cortical and subcortical T2 hyperintensities correspond with the areas of restricted diffusion. Mild atrophy and periventricular white matter changes are noted bilaterally as well. T2 changes extend along the cortical spinal tract on the right, likely related to remote ischemic injury. T2 changes extend into the brainstem. There is no flow in the right internal carotid artery at the skullbase. Flow is present in the posterior circulation. Flow is present in the left internal carotid artery and bilateral MCA vessels. No significant extra-axial fluid collection is present. Bilateral lens replacements are noted. The globes and orbits  are otherwise within normal limits bilaterally. Mild mucosal thickening is evident throughout the paranasal sinuses minimal fluid is present in the left maxillary sinus and right sphenoid sinus. There is more pronounced mucosal thickening in the left frontal sinus. The mastoid air cells are clear. MRA HEAD FINDINGS The right internal carotid artery is occluded. The left internal carotid artery is unremarkable from the high cervical segments through the ICA termini. The anterior communicating artery is patent. The left A1 and M1 segments are intact. The left MCA bifurcation is within normal limits. There is mild distal small vessel irregularity. There is slight decreased intensity of signal in the right A2 segments. There is significant attenuation of signal within the right A1 and M1 segments. That significantly affects the right MCA branch vessels as well. The left vertebral artery is the dominant vessel. There is a high-grade stenosis or occlusion of the right vertebral artery. The right AICA is dominant. The basilar artery is small. The right posterior cerebral artery originates from the basilar tip. The left posterior cerebral artery is of fetal type. Better signal is present in the right than left PCA branch vessels. MRA NECK FINDINGS Time-of-flight images demonstrate signal loss compatible with a high-grade stenosis at the left carotid bifurcation. The right internal carotid artery is occluded just beyond the bifurcation. Flow is antegrade in the left vertebral artery. The right vertebral artery is not visualized. The postcontrast images demonstrate a moderate stenosis at the origin of the left common carotid artery. There is a high-grade stenosis of the distal right common carotid artery. The right internal carotid artery is occluded 8 mm beyond the bifurcation. There is signal loss compatible with a high-grade stenosis in the proximal left internal and external carotid arteries. No distal stenoses are  present. There signal loss at the origin of the left vertebra artery suggesting moderate to high-grade stenosis. Mild tortuosity is present in the cervical left vertebral artery. There is no significant stenosis in the neck or intracranial. The right vertebral artery is hypoplastic with segmental narrowing beginning at the dural margin and inclusion proximal to the vertebrobasilar junction. IMPRESSION: 1. Bilateral watershed territory infarct, more prominent left than right. 2. 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 3. Attenuated flow in the right M1 segment with flow to the right via a patent anterior communicating artery comment from the left. 4. Asymmetric attenuation of flow into the right MCA branch vessels. 5. High-grade stenosis of the proximal left internal carotid artery. The left internal carotid artery feeds the entire anterior circulation and the left posterior circulation. 6. Moderate to high-grade proximal left vertebral artery stenosis. 7. High-grade stenosis and occlusion of the hypoplastic distal right vertebral artery. The right vertebral artery is not well seen in the neck and likely occluded proximally and reconstituted distally. 8. Fetal type left posterior cerebral artery. 9. Additional atrophy and white matter changes are noted in the  brain. Electronically Signed   By: San Morelle M.D.   On: 06/01/2015 18:04   Mr Brain Wo Contrast  06/01/2015  CLINICAL DATA:  Progressive weakness in the right arm over the last week. Unable but PEG up coffee this morning. Antrum and headache. Hypertension. Carotid bruits. EXAM: MRI HEAD WITHOUT AND WITH CONTRAST MRA HEAD WITHOUT CONTRAST MRA NECK WITHOUT AND WITH CONTRAST TECHNIQUE: Multiplanar, multiecho pulse sequences of the brain and surrounding structures were obtained without and with intravenous contrast. Angiographic images of the Circle of Willis were obtained using MRA technique  without intravenous contrast. Angiographic images of the neck were obtained using MRA technique without and with intravenous contrast. Carotid stenosis measurements (when applicable) are obtained utilizing NASCET criteria, using the distal internal carotid diameter as the denominator. CONTRAST:  65mL MULTIHANCE GADOBENATE DIMEGLUMINE 529 MG/ML IV SOLN COMPARISON:  CT head from the same day. FINDINGS: MRI HEAD FINDINGS The diffusion-weighted images confirm scattered foci of acute/ subacute infarct and both cerebral hemispheres. These cross a watershed distribution and a more prominent left than right. Anteriorly and superiorly the infarcts are similar on both sides. The areas of infarction extend more posteriorly on the left extending to the border of the left temporal and occipital lobes. Cortical and subcortical T2 hyperintensities correspond with the areas of restricted diffusion. Mild atrophy and periventricular white matter changes are noted bilaterally as well. T2 changes extend along the cortical spinal tract on the right, likely related to remote ischemic injury. T2 changes extend into the brainstem. There is no flow in the right internal carotid artery at the skullbase. Flow is present in the posterior circulation. Flow is present in the left internal carotid artery and bilateral MCA vessels. No significant extra-axial fluid collection is present. Bilateral lens replacements are noted. The globes and orbits are otherwise within normal limits bilaterally. Mild mucosal thickening is evident throughout the paranasal sinuses minimal fluid is present in the left maxillary sinus and right sphenoid sinus. There is more pronounced mucosal thickening in the left frontal sinus. The mastoid air cells are clear. MRA HEAD FINDINGS The right internal carotid artery is occluded. The left internal carotid artery is unremarkable from the high cervical segments through the ICA termini. The anterior communicating artery is  patent. The left A1 and M1 segments are intact. The left MCA bifurcation is within normal limits. There is mild distal small vessel irregularity. There is slight decreased intensity of signal in the right A2 segments. There is significant attenuation of signal within the right A1 and M1 segments. That significantly affects the right MCA branch vessels as well. The left vertebral artery is the dominant vessel. There is a high-grade stenosis or occlusion of the right vertebral artery. The right AICA is dominant. The basilar artery is small. The right posterior cerebral artery originates from the basilar tip. The left posterior cerebral artery is of fetal type. Better signal is present in the right than left PCA branch vessels. MRA NECK FINDINGS Time-of-flight images demonstrate signal loss compatible with a high-grade stenosis at the left carotid bifurcation. The right internal carotid artery is occluded just beyond the bifurcation. Flow is antegrade in the left vertebral artery. The right vertebral artery is not visualized. The postcontrast images demonstrate a moderate stenosis at the origin of the left common carotid artery. There is a high-grade stenosis of the distal right common carotid artery. The right internal carotid artery is occluded 8 mm beyond the bifurcation. There is signal loss compatible with a high-grade stenosis  in the proximal left internal and external carotid arteries. No distal stenoses are present. There signal loss at the origin of the left vertebra artery suggesting moderate to high-grade stenosis. Mild tortuosity is present in the cervical left vertebral artery. There is no significant stenosis in the neck or intracranial. The right vertebral artery is hypoplastic with segmental narrowing beginning at the dural margin and inclusion proximal to the vertebrobasilar junction. IMPRESSION: 1. Bilateral watershed territory infarct, more prominent left than right. 2. Occlusion of the right  internal carotid artery approximately 8 mm from the carotid bifurcation. 3. Attenuated flow in the right M1 segment with flow to the right via a patent anterior communicating artery comment from the left. 4. Asymmetric attenuation of flow into the right MCA branch vessels. 5. High-grade stenosis of the proximal left internal carotid artery. The left internal carotid artery feeds the entire anterior circulation and the left posterior circulation. 6. Moderate to high-grade proximal left vertebral artery stenosis. 7. High-grade stenosis and occlusion of the hypoplastic distal right vertebral artery. The right vertebral artery is not well seen in the neck and likely occluded proximally and reconstituted distally. 8. Fetal type left posterior cerebral artery. 9. Additional atrophy and white matter changes are noted in the brain. Electronically Signed   By: San Morelle M.D.   On: 06/01/2015 18:04    Microbiology: No results found for this or any previous visit (from the past 240 hour(s)).   Labs: Basic Metabolic Panel:  Recent Labs Lab 06/01/15 1243 06/01/15 2147 06/02/15 0346  NA 131*  --  138  K 4.1  --  4.5  CL 98*  --  102  CO2 23  --  29  GLUCOSE 108*  --  94  BUN 9  --  7  CREATININE 1.10 1.06 1.05  CALCIUM 9.3  --  9.5  MG  --  2.0  --   PHOS  --  2.6  --    Liver Function Tests:  Recent Labs Lab 06/01/15 1243 06/02/15 0346  AST 23 18  ALT 17 15*  ALKPHOS 54 48  BILITOT 0.5 0.4  PROT 7.1 6.4*  ALBUMIN 3.8 3.3*   No results for input(s): LIPASE, AMYLASE in the last 168 hours. No results for input(s): AMMONIA in the last 168 hours. CBC:  Recent Labs Lab 06/01/15 1243 06/01/15 2147 06/02/15 0346  WBC 8.3 7.4 6.9  NEUTROABS 4.9  --   --   HGB 13.3 12.0* 12.6*  HCT 39.4 36.5* 38.7*  MCV 91.0 91.5 92.1  PLT 316 284 294   Cardiac Enzymes:  Recent Labs Lab 06/01/15 2147  TROPONINI <0.03   BNP: BNP (last 3 results) No results for input(s): BNP in the  last 8760 hours.  ProBNP (last 3 results) No results for input(s): PROBNP in the last 8760 hours.  CBG: No results for input(s): GLUCAP in the last 168 hours.     SignedCristal Ford  [facility] 06/03/2015, 11:40 AM

## 2015-06-03 NOTE — Progress Notes (Signed)
STROKE TEAM PROGRESS NOTE   SUBJECTIVE (INTERVAL HISTORY) Wife at bedside. Pt up in chair. They both agree to stopping smoking.   OBJECTIVE Temp:  [97.9 F (36.6 C)-98.4 F (36.9 C)] 98.4 F (36.9 C) (10/21 0611) Pulse Rate:  [56-91] 56 (10/21 0611) Cardiac Rhythm:  [-] Normal sinus rhythm (10/20 1900) Resp:  [16-18] 18 (10/21 0611) BP: (126-157)/(56-79) 133/56 mmHg (10/21 0611) SpO2:  [96 %-100 %] 97 % (10/21 0611)  CBC:   Recent Labs Lab 06/01/15 1243 06/01/15 2147 06/02/15 0346  WBC 8.3 7.4 6.9  NEUTROABS 4.9  --   --   HGB 13.3 12.0* 12.6*  HCT 39.4 36.5* 38.7*  MCV 91.0 91.5 92.1  PLT 316 284 119   Basic Metabolic Panel:   Recent Labs Lab 06/01/15 1243 06/01/15 2147 06/02/15 0346  NA 131*  --  138  K 4.1  --  4.5  CL 98*  --  102  CO2 23  --  29  GLUCOSE 108*  --  94  BUN 9  --  7  CREATININE 1.10 1.06 1.05  CALCIUM 9.3  --  9.5  MG  --  2.0  --   PHOS  --  2.6  --    Lipid Panel:     Component Value Date/Time   CHOL 183 06/02/2015 0346   TRIG 99 06/02/2015 0346   HDL 51 06/02/2015 0346   CHOLHDL 3.6 06/02/2015 0346   VLDL 20 06/02/2015 0346   LDLCALC 112* 06/02/2015 0346   HgbA1c:  Lab Results  Component Value Date   HGBA1C 5.6 06/02/2015   Urine Drug Screen:     Component Value Date/Time   LABOPIA NONE DETECTED 06/01/2015 1315   COCAINSCRNUR NONE DETECTED 06/01/2015 1315   LABBENZ NONE DETECTED 06/01/2015 1315   AMPHETMU NONE DETECTED 06/01/2015 1315   THCU POSITIVE* 06/01/2015 1315   LABBARB NONE DETECTED 06/01/2015 1315      IMAGING  Dg Chest 2 View 06/01/2015   COPD changes. No acute abnormalities.   Ct Head Wo Contrast 06/01/2015   1. No evidence of large territory infarct or intracranial hemorrhage. No mass effect. 2. Mild chronic small vessel ischemic disease in the cerebral white matter. 3. Subcentimeter, subacute to chronic infarcts in the high frontal lobes.   Mri head, Mra head and neck Wo Contrast 06/01/2015  1.  Bilateral watershed territory infarct, more prominent left than right. 2. 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 3. Attenuated flow in the right M1 segment with flow to the right via a patent anterior communicating artery comment from the left. 4. Asymmetric attenuation of flow into the right MCA branch vessels. 5. High-grade stenosis of the proximal left internal carotid artery. The left internal carotid artery feeds the entire anterior circulation and the left posterior circulation. 6. Moderate to high-grade proximal left vertebral artery stenosis. 7. High-grade stenosis and occlusion of the hypoplastic distal right vertebral artery. The right vertebral artery is not well seen in the neck and likely occluded proximally and reconstituted distally. 8. Fetal type left posterior cerebral artery. 9. Additional atrophy and white matter changes are noted in the brain.   Carotid Doppler   Findings suggest >50% right common carotid artery stenosis. The right internal carotid artery exhibits string sign with distal tardus parvus waveforms, suggestive of near occlusion. The right external carotid artery exhibits low resistant flow, suggestive of compensatory flow. 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  The  left external carotid artery exhibits elevated velocities suggestive of >50% stenosis. The left internal carotid artery exhibits elevated velocities suggestive of 80-99% stenosis. The left vertebral artery is patent with antegrade flow.  2D Echocardiogram   - Left ventricle: The cavity size was normal. Systolic function wasnormal. The estimated ejection fraction was in the range of 55%to 60%. Wall motion was normal; there were no regional wallmotion abnormalities. - Atrial septum: No defect or patent foramen ovale was identified.   PHYSICAL EXAM  pleasant middle-age Caucasian male currently not in distress. . Afebrile. Head is  nontraumatic. Neck is supple without bruit.    Cardiac exam no murmur or gallop. Lungs are clear to auscultation. Distal pulses are well felt. Neurological Exam :  Awake alert oriented 3 with normal speech and language function. Extraocular movements are full range without nystagmus. Fundi were not visualized. Vision acuity seems adequate. Mild right lower facial asymmetry. Tongue is midline. Motor system exam reveals no upper or lower extremity drift. Mild weakness of the right grip and intrinsic hand muscles. No lower extremity weakness. Sensation is intact bilaterally. Deep tendon reflexes are symmetric. Plantars are downgoing. Gait was not tested    ASSESSMENT/PLAN Mr. JONATHIN HEINICKE is a 64 y.o. male with history of HTN, right carotid artery occlusion and "nearly occluded left carotid" as per patient, heavy smoker and alcohol abuse presenting with right arm weakness. He did not receive IV t-PA due to delay in arrival.   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 Suspect chronic left ICA occlusion   MRI  L>R watershed infarcts. Atrophy. WMD  MRA  Occlusion of right ICA. Decreased flow right M1. High-grade stenosis left ICA high-grade stenosis left and right vertebral arteries.  Carotid Doppler  Greater than 50% right CCA, near occlusion right ICA with compensatory flow, right vertebral artery occluded. Left ECA greater than 50% stenosis. Left ICA 80-99% stenosis.  2D Echo  No source of embolus   LDL 112  HgbA1c 5.6  Lovenox 40 mg sq daily for VTE prophylaxis Diet Heart Room service appropriate?: Yes; Fluid consistency:: Thin  No antithrombotic prior to admission, now on aspirin 81 mg daily. Added plavix . Ok with Early for OR on plavix  Patient counseled to be compliant with his antithrombotic medications  Ongoing aggressive stroke risk factor management  Therapy recommendations:   Supervision. No Physical therapy needs  Disposition:  Home. OR either Mon or Wed. Dr. Donnetta Hutching to followup  Carotid stenosis, bilateral  Right ICA with string sign  Left ICA 80-99% stenosis  VVS consult  Hypertension  Stable Permissive hypertension (OK if < 220/120) but gradually normalize in 5-7 days Ok to Keep blood pressure elevated given limited intracranial circulation  Hyperlipidemia  Home meds:  No statin   LDL 112, goal < 70  Added Lipitor 10 mg daily  Continue statin at discharge  Other Stroke Risk Factors  Cigarette smoker, agreeable to stop smoking  ETOH use  Family hx stroke (father)  Hospital day # [place] for Pager information 06/03/2015 8:59 AM  I have personally examined this patient, reviewed notes, independently viewed imaging studies, participated in medical decision making and plan of care. I have made any additions or clarifications directly to the above note. Agree with note above. I spoke to Dr. Trula Slade and we will try to be performed the surgery to come in  Monday if possible. Patient can be discharged on today on aspirin and Plavix he was advised to come to the emergency room if he had a recurrent stroke or TIA symptoms. Antony Contras, MD Medical Director [facility] Pager: [phone] 06/03/2015 6:20 PM  To contact Stroke Continuity provider, please refer to [email or web address] After hours, contact General Neurology

## 2015-06-03 NOTE — Progress Notes (Signed)
Discharge orders received. Pt educated on discharge instructions and stroke education. Pt given discharge packet and prescription. IV and tele removed. EMMI consent signed. Pt taken downstairs by staff via wheelchair.

## 2015-06-03 NOTE — Progress Notes (Signed)
    Subjective  -   No acute events   Physical Exam:  Slightly decreased grip strength on the right Regular rate and rhythm Nonlabored respirations       Assessment/Plan:    Acute bilateral stroke with known right carotid occlusion and progression of the left carotid stenosis over the past 10 months to greater than 80%.  The patient's neurologic symptoms are improving.  He is on dual agent antiplatelet therapy now.  I again discussed proceeding with carotid endarterectomy.  We will try to get this arranged for Monday with Dr. early.  The patient will remain in the hospital until his operation at the request of neurology.  Annamarie Major 06/03/2015 2:30 PM --  Filed Vitals:   06/03/15 1102  BP: 140/74  Pulse: 71  Temp: 98.3 F (36.8 C)  Resp: 20   No intake or output data in the 24 hours ending 06/03/15 1430   Laboratory CBC    Component Value Date/Time   WBC 6.9 06/02/2015 0346   HGB 12.6* 06/02/2015 0346   HCT 38.7* 06/02/2015 0346   PLT 294 06/02/2015 0346    BMET    Component Value Date/Time   NA 138 06/02/2015 0346   K 4.5 06/02/2015 0346   CL 102 06/02/2015 0346   CO2 29 06/02/2015 0346   GLUCOSE 94 06/02/2015 0346   BUN 7 06/02/2015 0346   CREATININE 1.05 06/02/2015 0346   CREATININE 1.30 07/20/2014 1127   CALCIUM 9.5 06/02/2015 0346   GFRNONAA >60 06/02/2015 0346   GFRAA >60 06/02/2015 0346    COAG Lab Results  Component Value Date   INR 1.00 06/01/2015   No results found for: PTT  Antibiotics Anti-infectives    None       V. Leia Alf, M.D. [facility] Office: [phone] Pager:  [phone]

## 2015-06-05 MED ORDER — SODIUM CHLORIDE 0.9 % IV SOLN: INTRAVENOUS | Status: DC

## 2015-06-05 MED ORDER — MUPIROCIN 2 % EX OINT
1.0000 "application " | TOPICAL_OINTMENT | Freq: Once | CUTANEOUS | Status: DC
  Filled 2015-06-05: qty 22

## 2015-06-05 MED ORDER — CEFUROXIME SODIUM 1.5 G IJ SOLR
1.5000 g | INTRAMUSCULAR | Status: AC
  Administered 2015-06-06: 1.5 g via INTRAVENOUS
  Filled 2015-06-05: qty 1.5

## 2015-06-05 MED ORDER — CHLORHEXIDINE GLUCONATE CLOTH 2 % EX PADS: 6.0000 | MEDICATED_PAD | Freq: Once | CUTANEOUS | Status: DC

## 2015-06-06 ENCOUNTER — Inpatient Hospital Stay (HOSPITAL_COMMUNITY): Payer: Commercial Managed Care - HMO | Admitting: Anesthesiology

## 2015-06-06 ENCOUNTER — Inpatient Hospital Stay (HOSPITAL_COMMUNITY)
Admission: RE | Admit: 2015-06-06 | Discharge: 2015-06-07 | DRG: 039 | Disposition: A | Discharge status: Home / Self Care | Payer: Commercial Managed Care - HMO | Source: Ambulatory Visit | Attending: Vascular Surgery | Admitting: Vascular Surgery

## 2015-06-06 ENCOUNTER — Encounter (HOSPITAL_COMMUNITY)
Admission: RE | Disposition: A | Discharge status: Home / Self Care | Payer: Self-pay | Source: Ambulatory Visit | Attending: Vascular Surgery

## 2015-06-06 ENCOUNTER — Encounter (HOSPITAL_COMMUNITY): Payer: Self-pay | Admitting: *Deleted

## 2015-06-06 DIAGNOSIS — J449 Chronic obstructive pulmonary disease, unspecified: Secondary | ICD-10-CM | POA: Diagnosis not present

## 2015-06-06 DIAGNOSIS — F17200 Nicotine dependence, unspecified, uncomplicated: Secondary | ICD-10-CM | POA: Diagnosis not present

## 2015-06-06 DIAGNOSIS — Z8673 Personal history of transient ischemic attack (TIA), and cerebral infarction without residual deficits: Secondary | ICD-10-CM | POA: Diagnosis not present

## 2015-06-06 DIAGNOSIS — I6522 Occlusion and stenosis of left carotid artery: Secondary | ICD-10-CM | POA: Diagnosis not present

## 2015-06-06 DIAGNOSIS — I6523 Occlusion and stenosis of bilateral carotid arteries: Principal | ICD-10-CM | POA: Diagnosis present

## 2015-06-06 DIAGNOSIS — I10 Essential (primary) hypertension: Secondary | ICD-10-CM | POA: Diagnosis not present

## 2015-06-06 DIAGNOSIS — M1990 Unspecified osteoarthritis, unspecified site: Secondary | ICD-10-CM | POA: Diagnosis not present

## 2015-06-06 HISTORY — PX: PATCH ANGIOPLASTY: SHX6230

## 2015-06-06 HISTORY — PX: ENDARTERECTOMY: SHX5162

## 2015-06-06 LAB — URINALYSIS, ROUTINE W REFLEX MICROSCOPIC
Bilirubin Urine: NEGATIVE
Glucose, UA: NEGATIVE mg/dL
Hgb urine dipstick: NEGATIVE
Ketones, ur: NEGATIVE mg/dL
LEUKOCYTES UA: NEGATIVE
NITRITE: NEGATIVE
PROTEIN: NEGATIVE mg/dL
Specific Gravity, Urine: 1.021 (ref 1.005–1.030)
UROBILINOGEN UA: 0.2 mg/dL (ref 0.0–1.0)
pH: 5 (ref 5.0–8.0)

## 2015-06-06 LAB — CBC
HCT: 33.4 % — ABNORMAL LOW (ref 39.0–52.0)
Hemoglobin: 11.3 g/dL — ABNORMAL LOW (ref 13.0–17.0)
MCH: 30.6 pg (ref 26.0–34.0)
MCHC: 33.8 g/dL (ref 30.0–36.0)
MCV: 90.5 fL (ref 78.0–100.0)
PLATELETS: 296 10*3/uL (ref 150–400)
RBC: 3.69 MIL/uL — ABNORMAL LOW (ref 4.22–5.81)
RDW: 12.5 % (ref 11.5–15.5)
WBC: 10.7 10*3/uL — AB (ref 4.0–10.5)

## 2015-06-06 LAB — CREATININE, SERUM
CREATININE: 0.95 mg/dL (ref 0.61–1.24)
GFR calc non Af Amer: >60 mL/min (ref >60)

## 2015-06-06 LAB — TYPE AND SCREEN
ABO/RH(D): O NEG
ANTIBODY SCREEN: NEGATIVE

## 2015-06-06 LAB — ABO/RH: ABO/RH(D): O NEG

## 2015-06-06 SURGERY — ENDARTERECTOMY, CAROTID
Anesthesia: General | Site: Neck | Laterality: Left

## 2015-06-06 MED ORDER — ADULT MULTIVITAMIN W/MINERALS CH
1.0000 | ORAL_TABLET | Freq: Every day | ORAL | Status: DC
Start: 2015-06-06 — End: 2015-06-07
  Administered 2015-06-06 – 2015-06-07 (×2): 1 via ORAL
  Filled 2015-06-06 (×2): qty 1

## 2015-06-06 MED ORDER — NEOSTIGMINE METHYLSULFATE 10 MG/10ML IV SOLN
INTRAVENOUS | Status: DC | PRN
  Administered 2015-06-06: 4 mg via INTRAVENOUS

## 2015-06-06 MED ORDER — MIDAZOLAM HCL 2 MG/2ML IJ SOLN
INTRAMUSCULAR | Status: AC
  Filled 2015-06-06: qty 4

## 2015-06-06 MED ORDER — MAGNESIUM SULFATE 2 GM/50ML IV SOLN: 2.0000 g | Freq: Every day | INTRAVENOUS | Status: DC | PRN

## 2015-06-06 MED ORDER — NICOTINE 21 MG/24HR TD PT24
21.0000 mg | MEDICATED_PATCH | Freq: Every day | TRANSDERMAL | Status: DC
  Administered 2015-06-06 – 2015-06-07 (×2): 21 mg via TRANSDERMAL
  Filled 2015-06-06 (×2): qty 1

## 2015-06-06 MED ORDER — NITROGLYCERIN 0.2 MG/ML ON CALL CATH LAB
INTRAVENOUS | Status: DC | PRN
  Administered 2015-06-06: 40 ug via INTRAVENOUS

## 2015-06-06 MED ORDER — LIDOCAINE HCL (CARDIAC) 20 MG/ML IV SOLN
INTRAVENOUS | Status: DC | PRN
  Administered 2015-06-06: 100 mg via INTRAVENOUS
  Administered 2015-06-06: 100 mg via INTRATRACHEAL

## 2015-06-06 MED ORDER — ONDANSETRON HCL 4 MG/2ML IJ SOLN: 4.0000 mg | Freq: Once | INTRAMUSCULAR | Status: DC | PRN

## 2015-06-06 MED ORDER — PHENYLEPHRINE HCL 10 MG/ML IJ SOLN
10.0000 mg | INTRAVENOUS | Status: DC | PRN
  Administered 2015-06-06: 20 ug/min via INTRAVENOUS

## 2015-06-06 MED ORDER — FENTANYL CITRATE (PF) 100 MCG/2ML IJ SOLN
INTRAMUSCULAR | Status: DC | PRN
  Administered 2015-06-06 (×2): 25 ug via INTRAVENOUS
  Administered 2015-06-06 (×2): 50 ug via INTRAVENOUS

## 2015-06-06 MED ORDER — SODIUM CHLORIDE 0.9 % IV SOLN: 500.0000 mL | Freq: Once | INTRAVENOUS | Status: DC | PRN

## 2015-06-06 MED ORDER — LABETALOL HCL 5 MG/ML IV SOLN
INTRAVENOUS | Status: DC | PRN
  Administered 2015-06-06: 2.5 mg via INTRAVENOUS
  Administered 2015-06-06: 5 mg via INTRAVENOUS
  Administered 2015-06-06 (×2): 2.5 mg via INTRAVENOUS
  Administered 2015-06-06: 5 mg via INTRAVENOUS
  Administered 2015-06-06: 2.5 mg via INTRAVENOUS

## 2015-06-06 MED ORDER — DOCUSATE SODIUM 100 MG PO CAPS: 100.0000 mg | ORAL_CAPSULE | Freq: Every day | ORAL | Status: DC

## 2015-06-06 MED ORDER — LABETALOL HCL 5 MG/ML IV SOLN
INTRAVENOUS | Status: AC
Start: 2015-06-06 — End: 2015-06-06
  Filled 2015-06-06: qty 4

## 2015-06-06 MED ORDER — GLYCOPYRROLATE 0.2 MG/ML IJ SOLN
INTRAMUSCULAR | Status: AC
  Filled 2015-06-06: qty 3

## 2015-06-06 MED ORDER — ACETAMINOPHEN 650 MG RE SUPP: 325.0000 mg | RECTAL | Status: DC | PRN

## 2015-06-06 MED ORDER — HYDRALAZINE HCL 20 MG/ML IJ SOLN: 5.0000 mg | INTRAMUSCULAR | Status: DC | PRN

## 2015-06-06 MED ORDER — ACETAMINOPHEN 325 MG PO TABS: 325.0000 mg | ORAL_TABLET | ORAL | Status: DC | PRN

## 2015-06-06 MED ORDER — NEOSTIGMINE METHYLSULFATE 10 MG/10ML IV SOLN
INTRAVENOUS | Status: AC
  Filled 2015-06-06: qty 1

## 2015-06-06 MED ORDER — SODIUM CHLORIDE 0.9 % IV SOLN
INTRAVENOUS | Status: DC
  Administered 2015-06-06: 50 mL/h via INTRAVENOUS

## 2015-06-06 MED ORDER — FENTANYL CITRATE (PF) 100 MCG/2ML IJ SOLN
INTRAMUSCULAR | Status: AC
  Administered 2015-06-06: 50 ug via INTRAVENOUS
  Filled 2015-06-06: qty 2

## 2015-06-06 MED ORDER — OXYCODONE HCL 5 MG PO TABS
ORAL_TABLET | ORAL | Status: AC
  Administered 2015-06-06: 10 mg via ORAL
  Filled 2015-06-06: qty 2

## 2015-06-06 MED ORDER — HEPARIN SODIUM (PORCINE) 1000 UNIT/ML IJ SOLN
INTRAMUSCULAR | Status: AC
  Filled 2015-06-06: qty 1

## 2015-06-06 MED ORDER — PHENOL 1.4 % MT LIQD: 1.0000 | OROMUCOSAL | Status: DC | PRN

## 2015-06-06 MED ORDER — POTASSIUM CHLORIDE CRYS ER 20 MEQ PO TBCR
20.0000 meq | EXTENDED_RELEASE_TABLET | Freq: Every day | ORAL | Status: DC | PRN
Start: 2015-06-06 — End: 2015-06-07

## 2015-06-06 MED ORDER — LACTATED RINGERS IV SOLN
INTRAVENOUS | Status: DC | PRN
Start: 2015-06-06 — End: 2015-06-06
  Administered 2015-06-06 (×2): via INTRAVENOUS

## 2015-06-06 MED ORDER — LIDOCAINE HCL (CARDIAC) 20 MG/ML IV SOLN
INTRAVENOUS | Status: AC
  Filled 2015-06-06: qty 10

## 2015-06-06 MED ORDER — PANTOPRAZOLE SODIUM 40 MG PO TBEC
40.0000 mg | DELAYED_RELEASE_TABLET | Freq: Every day | ORAL | Status: DC
  Administered 2015-06-06: 40 mg via ORAL
  Filled 2015-06-06: qty 1

## 2015-06-06 MED ORDER — ASPIRIN EC 81 MG PO TBEC
81.0000 mg | DELAYED_RELEASE_TABLET | Freq: Every day | ORAL | Status: DC
  Administered 2015-06-06 – 2015-06-07 (×2): 81 mg via ORAL
  Filled 2015-06-06 (×2): qty 1

## 2015-06-06 MED ORDER — ALUM & MAG HYDROXIDE-SIMETH 200-200-20 MG/5ML PO SUSP: 15.0000 mL | ORAL | Status: DC | PRN

## 2015-06-06 MED ORDER — PROTAMINE SULFATE 10 MG/ML IV SOLN
INTRAVENOUS | Status: DC | PRN
  Administered 2015-06-06: 10 mg via INTRAVENOUS
  Administered 2015-06-06: 50 mg via INTRAVENOUS
  Administered 2015-06-06: 40 mg via INTRAVENOUS

## 2015-06-06 MED ORDER — LABETALOL HCL 5 MG/ML IV SOLN: 10.0000 mg | INTRAVENOUS | Status: DC | PRN

## 2015-06-06 MED ORDER — PROPOFOL 10 MG/ML IV BOLUS
INTRAVENOUS | Status: DC | PRN
  Administered 2015-06-06: 150 mg via INTRAVENOUS
  Administered 2015-06-06: 50 mg via INTRAVENOUS

## 2015-06-06 MED ORDER — CLOPIDOGREL BISULFATE 75 MG PO TABS
75.0000 mg | ORAL_TABLET | Freq: Every day | ORAL | Status: DC
  Administered 2015-06-07: 75 mg via ORAL
  Filled 2015-06-06: qty 1

## 2015-06-06 MED ORDER — GLYCOPYRROLATE 0.2 MG/ML IJ SOLN
INTRAMUSCULAR | Status: DC | PRN
  Administered 2015-06-06: .5 mg via INTRAVENOUS

## 2015-06-06 MED ORDER — HEPARIN SODIUM (PORCINE) 1000 UNIT/ML IJ SOLN
INTRAMUSCULAR | Status: DC | PRN
  Administered 2015-06-06: 8000 [IU] via INTRAVENOUS

## 2015-06-06 MED ORDER — GUAIFENESIN-DM 100-10 MG/5ML PO SYRP: 15.0000 mL | ORAL_SOLUTION | ORAL | Status: DC | PRN

## 2015-06-06 MED ORDER — ENOXAPARIN SODIUM 40 MG/0.4ML ~~LOC~~ SOLN
40.0000 mg | SUBCUTANEOUS | Status: DC
  Filled 2015-06-06: qty 0.4

## 2015-06-06 MED ORDER — ROCURONIUM BROMIDE 50 MG/5ML IV SOLN
INTRAVENOUS | Status: AC
  Filled 2015-06-06: qty 1

## 2015-06-06 MED ORDER — MORPHINE SULFATE (PF) 2 MG/ML IV SOLN: 2.0000 mg | INTRAVENOUS | Status: DC | PRN

## 2015-06-06 MED ORDER — ATORVASTATIN CALCIUM 10 MG PO TABS
10.0000 mg | ORAL_TABLET | Freq: Every day | ORAL | Status: DC
  Administered 2015-06-06: 10 mg via ORAL
  Filled 2015-06-06 (×2): qty 1

## 2015-06-06 MED ORDER — METOPROLOL TARTRATE 1 MG/ML IV SOLN
2.0000 mg | INTRAVENOUS | Status: DC | PRN
Start: 2015-06-06 — End: 2015-06-07

## 2015-06-06 MED ORDER — FENTANYL CITRATE (PF) 100 MCG/2ML IJ SOLN
INTRAMUSCULAR | Status: AC
  Filled 2015-06-06: qty 2

## 2015-06-06 MED ORDER — ROCURONIUM BROMIDE 100 MG/10ML IV SOLN
INTRAVENOUS | Status: DC | PRN
  Administered 2015-06-06: 50 mg via INTRAVENOUS

## 2015-06-06 MED ORDER — LIDOCAINE HCL (PF) 1 % IJ SOLN
INTRAMUSCULAR | Status: AC
  Filled 2015-06-06: qty 30

## 2015-06-06 MED ORDER — HEMOSTATIC AGENTS (NO CHARGE) OPTIME
TOPICAL | Status: DC | PRN
  Administered 2015-06-06: 1 via TOPICAL

## 2015-06-06 MED ORDER — SODIUM CHLORIDE 0.9 % IV SOLN
INTRAVENOUS | Status: DC | PRN
  Administered 2015-06-06: 500 mL

## 2015-06-06 MED ORDER — ONDANSETRON HCL 4 MG/2ML IJ SOLN: 4.0000 mg | Freq: Four times a day (QID) | INTRAMUSCULAR | Status: DC | PRN

## 2015-06-06 MED ORDER — FOLIC ACID 1 MG PO TABS
1.0000 mg | ORAL_TABLET | Freq: Every day | ORAL | Status: DC
  Administered 2015-06-06 – 2015-06-07 (×2): 1 mg via ORAL
  Filled 2015-06-06 (×2): qty 1

## 2015-06-06 MED ORDER — 0.9 % SODIUM CHLORIDE (POUR BTL) OPTIME
TOPICAL | Status: DC | PRN
  Administered 2015-06-06: 2000 mL

## 2015-06-06 MED ORDER — DEXTROSE 5 % IV SOLN
1.5000 g | Freq: Two times a day (BID) | INTRAVENOUS | Status: AC
  Administered 2015-06-06 – 2015-06-07 (×2): 1.5 g via INTRAVENOUS
  Filled 2015-06-06 (×2): qty 1.5

## 2015-06-06 MED ORDER — PROTAMINE SULFATE 10 MG/ML IV SOLN
INTRAVENOUS | Status: AC
  Filled 2015-06-06: qty 5

## 2015-06-06 MED ORDER — LACTATED RINGERS IV SOLN
INTRAVENOUS | Status: DC | PRN
  Administered 2015-06-06: 07:00:00 via INTRAVENOUS

## 2015-06-06 MED ORDER — SODIUM CHLORIDE 0.9 % IJ SOLN
INTRAMUSCULAR | Status: AC
  Filled 2015-06-06: qty 10

## 2015-06-06 MED ORDER — OXYCODONE HCL 5 MG PO TABS
5.0000 mg | ORAL_TABLET | ORAL | Status: DC | PRN
  Administered 2015-06-06 (×2): 5 mg via ORAL
  Administered 2015-06-06: 10 mg via ORAL
  Administered 2015-06-07 (×2): 5 mg via ORAL
  Filled 2015-06-06 (×4): qty 1

## 2015-06-06 MED ORDER — VECURONIUM BROMIDE 10 MG IV SOLR
INTRAVENOUS | Status: DC | PRN
  Administered 2015-06-06 (×3): 1 mg via INTRAVENOUS

## 2015-06-06 MED ORDER — FENTANYL CITRATE (PF) 250 MCG/5ML IJ SOLN
INTRAMUSCULAR | Status: AC
  Filled 2015-06-06: qty 5

## 2015-06-06 MED ORDER — PROPOFOL 10 MG/ML IV BOLUS
INTRAVENOUS | Status: AC
  Filled 2015-06-06: qty 20

## 2015-06-06 MED ORDER — EPHEDRINE SULFATE 50 MG/ML IJ SOLN
INTRAMUSCULAR | Status: AC
  Filled 2015-06-06: qty 1

## 2015-06-06 MED ORDER — EPHEDRINE SULFATE 50 MG/ML IJ SOLN
INTRAMUSCULAR | Status: DC | PRN
  Administered 2015-06-06: 5 mg via INTRAVENOUS

## 2015-06-06 MED ORDER — FENTANYL CITRATE (PF) 100 MCG/2ML IJ SOLN
25.0000 ug | INTRAMUSCULAR | Status: DC | PRN
  Administered 2015-06-06 (×3): 50 ug via INTRAVENOUS

## 2015-06-06 MED ORDER — SUCCINYLCHOLINE CHLORIDE 20 MG/ML IJ SOLN
INTRAMUSCULAR | Status: AC
  Filled 2015-06-06: qty 1

## 2015-06-06 MED ORDER — VITAMIN B-1 100 MG PO TABS
100.0000 mg | ORAL_TABLET | Freq: Every day | ORAL | Status: DC
  Administered 2015-06-06 – 2015-06-07 (×2): 100 mg via ORAL
  Filled 2015-06-06 (×2): qty 1

## 2015-06-06 MED ORDER — ONDANSETRON HCL 4 MG/2ML IJ SOLN
INTRAMUSCULAR | Status: DC | PRN
  Administered 2015-06-06: 4 mg via INTRAVENOUS

## 2015-06-06 SURGICAL SUPPLY — 49 items
BENZOIN TINCTURE PRP APPL 2/3 (GAUZE/BANDAGES/DRESSINGS) ×2 IMPLANT
CANISTER SUCTION 2500CC (MISCELLANEOUS) ×2 IMPLANT
CANNULA VESSEL 3MM 2 BLNT TIP (CANNULA) IMPLANT
CATH ROBINSON RED A/P 18FR (CATHETERS) ×2 IMPLANT
CLIP LIGATING EXTRA MED SLVR (CLIP) ×2 IMPLANT
CLIP LIGATING EXTRA SM BLUE (MISCELLANEOUS) ×2 IMPLANT
CLSR STERI-STRIP ANTIMIC 1/2X4 (GAUZE/BANDAGES/DRESSINGS) ×2 IMPLANT
CRADLE DONUT ADULT HEAD (MISCELLANEOUS) ×2 IMPLANT
DECANTER SPIKE VIAL GLASS SM (MISCELLANEOUS) IMPLANT
DRAIN HEMOVAC 1/8 X 5 (WOUND CARE) IMPLANT
DRSG COVADERM 4X8 (GAUZE/BANDAGES/DRESSINGS) ×2 IMPLANT
ELECT REM PT RETURN 9FT ADLT (ELECTROSURGICAL) ×2
ELECTRODE REM PT RTRN 9FT ADLT (ELECTROSURGICAL) ×1 IMPLANT
EVACUATOR SILICONE 100CC (DRAIN) IMPLANT
GAUZE SPONGE 4X4 12PLY STRL (GAUZE/BANDAGES/DRESSINGS) ×2 IMPLANT
GEL ULTRASOUND 20GR AQUASONIC (MISCELLANEOUS) IMPLANT
GLOVE BIO SURGEON STRL SZ 6.5 (GLOVE) ×4 IMPLANT
GLOVE BIO SURGEON STRL SZ7.5 (GLOVE) ×2 IMPLANT
GLOVE BIOGEL PI IND STRL 6.5 (GLOVE) ×4 IMPLANT
GLOVE BIOGEL PI IND STRL 8 (GLOVE) ×2 IMPLANT
GLOVE BIOGEL PI INDICATOR 6.5 (GLOVE) ×4
GLOVE BIOGEL PI INDICATOR 8 (GLOVE) ×2
GLOVE SS BIOGEL STRL SZ 7.5 (GLOVE) ×1 IMPLANT
GLOVE SUPERSENSE BIOGEL SZ 7.5 (GLOVE) ×1
GLOVE SURG SS PI 7.5 STRL IVOR (GLOVE) ×2 IMPLANT
GOWN STRL REUS W/ TWL LRG LVL3 (GOWN DISPOSABLE) ×4 IMPLANT
GOWN STRL REUS W/TWL LRG LVL3 (GOWN DISPOSABLE) ×4
HEMOSTAT SNOW SURGICEL 2X4 (HEMOSTASIS) ×2 IMPLANT
KIT BASIN OR (CUSTOM PROCEDURE TRAY) ×2 IMPLANT
KIT ROOM TURNOVER OR (KITS) ×2 IMPLANT
NEEDLE 22X1 1/2 (OR ONLY) (NEEDLE) IMPLANT
NS IRRIG 1000ML POUR BTL (IV SOLUTION) ×4 IMPLANT
PACK CAROTID (CUSTOM PROCEDURE TRAY) ×2 IMPLANT
PAD ARMBOARD 7.5X6 YLW CONV (MISCELLANEOUS) ×4 IMPLANT
PATCH HEMASHIELD 8X75 (Vascular Products) ×2 IMPLANT
SHUNT CAROTID BYPASS 10 (VASCULAR PRODUCTS) IMPLANT
SHUNT CAROTID BYPASS 12FRX15.5 (VASCULAR PRODUCTS) IMPLANT
SPONGE INTESTINAL PEANUT (DISPOSABLE) ×2 IMPLANT
SPONGE LAP 18X18 X RAY DECT (DISPOSABLE) ×2 IMPLANT
STRIP CLOSURE SKIN 1/2X4 (GAUZE/BANDAGES/DRESSINGS) ×2 IMPLANT
SUT ETHILON 3 0 PS 1 (SUTURE) IMPLANT
SUT PROLENE 6 0 CC (SUTURE) ×6 IMPLANT
SUT SILK 3 0 (SUTURE)
SUT SILK 3-0 18XBRD TIE 12 (SUTURE) IMPLANT
SUT VIC AB 3-0 SH 27 (SUTURE) ×2
SUT VIC AB 3-0 SH 27X BRD (SUTURE) ×2 IMPLANT
SUT VICRYL 4-0 PS2 18IN ABS (SUTURE) ×2 IMPLANT
SYR CONTROL 10ML LL (SYRINGE) IMPLANT
WATER STERILE IRR 1000ML POUR (IV SOLUTION) ×2 IMPLANT

## 2015-06-06 NOTE — Anesthesia Postprocedure Evaluation (Signed)
  Anesthesia Post-op Note  Patient: Connor Butler  Procedure(s) Performed: Procedure(s) (LRB): LEFT  CAROTID ARTERY ENDARTERECTOMY (Left) WITH HEMASHIELD PATCH ANGIOPLASTY (Left)  Patient Location: PACU  Anesthesia Type: General  Level of Consciousness: awake and alert   Airway and Oxygen Therapy: Patient Spontanous Breathing  Post-op Pain: mild  Post-op Assessment: Post-op Vital signs reviewed, Patient's Cardiovascular Status Stable, Respiratory Function Stable, Patent Airway and No signs of Nausea or vomiting  Last Vitals:  Filed Vitals:   06/06/15 1135  BP: 111/57  Pulse:   Temp: 36.4 C  Resp:     Post-op Vital Signs: stable   Complications: No apparent anesthesia complications

## 2015-06-06 NOTE — Progress Notes (Signed)
UR COMPLETED  

## 2015-06-06 NOTE — Interval H&P Note (Signed)
History and Physical Interval Note:  06/06/2015 6:59 AM  Connor Butler  has presented today for surgery, with the diagnosis of Left carotid artery stenosis I65.22  The various methods of treatment have been discussed with the patient and family. After consideration of risks, benefits and other options for treatment, the patient has consented to  Procedure(s): ENDARTERECTOMY CAROTID (Left) as a surgical intervention .  The patient's history has been reviewed, patient examined, no change in status, stable for surgery.  I have reviewed the patient's chart and labs.  Questions were answered to the patient's satisfaction.     Curt Jews

## 2015-06-06 NOTE — Op Note (Signed)
     Patient name: Connor Butler MRN: [id number] DOB: 12-25-1950 Sex: male  06/06/2015 Pre-operative Diagnosis: Symptomatic left carotid stenosis Post-operative diagnosis:  Same Surgeon:  Rosetta Posner, M.D. Assistants:  Pablo Ledger Procedure:    left carotid Endarterectomy with Dacron patch angioplasty Anesthesia:  General Blood Loss:  See anesthesia record   Indications for surgery:  Preop CVA  Procedure in detail:  The patient was taken to the operating and placed in the supine position. The neck was prepped and draped in the usual sterile fashion. An incision was made anterior to the sternocleidomastoid muscle and continued with electrocautery through the platysma muscle. The muscle was retracted posteriorly and the carotid sheath was opened. The facial vein was ligated with 2-0 silk ties and divided. The common carotid artery was encircled with an umbilical tape and Rummel tourniquet. Dissection was continued onto the carotid bifurcation. The superior thyroid artery was controlled with a 2-0 silk Potts tie. The external carotid organ was encircled with a vessel loop and the internal carotid was encircled with umbilical tape and Rummel tourniquet. The hypoglossal and vagus nerves were identified and preserved.  The patient was given systemic heparinization. After adequate circulation time, the internal,external and common carotid arteries were occluded. The common carotid was opened with an 11 blade and the arteriotomy was continued with Potts scissors onto the internal carotid artery. A 10 shunt was passed up the internal carotid artery, allowed to back bleed, and then passed down the common carotid artery. The shunt was secured with Rummel tourniquet. The endarterectomy was begun on the common carotid artery  plaque was divided proximally with Potts scissors. The endarterectomy was continued onto the carotid bifurcation. The external carotid was endarterectomized by eversion technique and the  internal carotid artery was endarterectomized in an open fashion. Remaining debris was removed from the endarterectomy plane. A Dacron patch was brought to the field and sewn as a patch angioplasty. Prior to completing the anastomosis, the shunt was removed and the usual flushing maneuvers were undertaken. The anastomosis was then completed and flow was restored first to the external and then the internal carotid artery. Excellent flow characteristics were noted with hand-held Doppler in the internal and external carotid arteries.  The patient was given protamine to reverse the heparin. Hemostasis was obtained with electrocautery. The wounds were irrigated with saline. The wound was closed by first reapproximating the sternocleidomastoid muscle over the carotid artery with interrupted 3-0 Vicryl sutures. Next, the platysma was closed with a running 3-0 Vicryl suture. The skin was closed with a 4-0 subcuticular Vicryl suture. Benzoin and Steri-Strips were applied to the incision. A sterile dressing was placed over the incision. All sponge and needle counts were correct. The patient was awakened in the operating room, neurologically intact. They were transferred to the PACU in stable condition.  Carotid stenosis at surgery:>80%  Disposition:  To PACU in stable condition,neurologically intact  Relevant Operative Details:  Relatively low bifurcation. Known occlusion contralateral right carotid and right vertebral arteries  Rosetta Posner, M.D. [facility] [place] Office: [phone] Pager:  [phone]

## 2015-06-06 NOTE — H&P (View-Only) (Signed)
    Subjective  -   No acute events   Physical Exam:  Slightly decreased grip strength on the right Regular rate and rhythm Nonlabored respirations       Assessment/Plan:    Acute bilateral stroke with known right carotid occlusion and progression of the left carotid stenosis over the past 10 months to greater than 80%.  The patient's neurologic symptoms are improving.  He is on dual agent antiplatelet therapy now.  I again discussed proceeding with carotid endarterectomy.  We will try to get this arranged for Monday with Dr. early.  The patient will remain in the hospital until his operation at the request of neurology.  Annamarie Major 06/03/2015 2:30 PM --  Filed Vitals:   06/03/15 1102  BP: 140/74  Pulse: 71  Temp: 98.3 F (36.8 C)  Resp: 20   No intake or output data in the 24 hours ending 06/03/15 1430   Laboratory CBC    Component Value Date/Time   WBC 6.9 06/02/2015 0346   HGB 12.6* 06/02/2015 0346   HCT 38.7* 06/02/2015 0346   PLT 294 06/02/2015 0346    BMET    Component Value Date/Time   NA 138 06/02/2015 0346   K 4.5 06/02/2015 0346   CL 102 06/02/2015 0346   CO2 29 06/02/2015 0346   GLUCOSE 94 06/02/2015 0346   BUN 7 06/02/2015 0346   CREATININE 1.05 06/02/2015 0346   CREATININE 1.30 07/20/2014 1127   CALCIUM 9.5 06/02/2015 0346   GFRNONAA >60 06/02/2015 0346   GFRAA >60 06/02/2015 0346    COAG Lab Results  Component Value Date   INR 1.00 06/01/2015   No results found for: PTT  Antibiotics Anti-infectives    None       V. Leia Alf, M.D. [facility] [place] Office: [phone] Pager:  [phone]

## 2015-06-06 NOTE — Anesthesia Procedure Notes (Signed)
Procedure Name: Intubation Date/Time: 06/06/2015 7:55 AM Performed by: Jenne Campus Pre-anesthesia Checklist: Patient identified, Emergency Drugs available, Suction available, Patient being monitored and Timeout performed Patient Re-evaluated:Patient Re-evaluated prior to inductionOxygen Delivery Method: Circle system utilized Preoxygenation: Pre-oxygenation with 100% oxygen Intubation Type: IV induction Ventilation: Two handed mask ventilation required Laryngoscope Size: Mac and 3 Grade View: Grade I Tube type: Oral Tube size: 7.5 mm Number of attempts: 1 Airway Equipment and Method: Stylet Placement Confirmation: ETT inserted through vocal cords under direct vision,  positive ETCO2 and breath sounds checked- equal and bilateral Secured at: 23 cm Tube secured with: Tape Dental Injury: Teeth and Oropharynx as per pre-operative assessment

## 2015-06-06 NOTE — Transfer of Care (Signed)
Immediate Anesthesia Transfer of Care Note  Patient: Connor Butler  Procedure(s) Performed: Procedure(s): LEFT  CAROTID ARTERY ENDARTERECTOMY (Left) WITH HEMASHIELD PATCH ANGIOPLASTY (Left)  Patient Location: PACU  Anesthesia Type:General  Level of Consciousness: awake, alert , oriented and patient cooperative  Airway & Oxygen Therapy: Patient Spontanous Breathing and Patient connected to nasal cannula oxygen  Post-op Assessment: Report given to RN and Post -op Vital signs reviewed and stable  Post vital signs: Reviewed  Last Vitals:  Filed Vitals:   06/06/15 0614  BP: 154/81  Pulse: 98  Temp: 36.8 C  Resp: 18    Complications: No apparent anesthesia complications

## 2015-06-06 NOTE — Anesthesia Preprocedure Evaluation (Addendum)
Anesthesia Evaluation  Patient identified by MRN, date of birth, ID band Patient awake    Reviewed: Allergy & Precautions, H&P , NPO status , Patient's Chart, lab work & pertinent test results  History of Anesthesia Complications Negative for: history of anesthetic complications  Airway Mallampati: II  TM Distance: >3 FB Neck ROM: full    Dental no notable dental hx. (+) Edentulous Upper, Edentulous Lower, Dental Advisory Given   Pulmonary COPD, Current Smoker, former smoker,    Pulmonary exam normal breath sounds clear to auscultation       Cardiovascular hypertension, Pt. on medications + Peripheral Vascular Disease  Normal cardiovascular exam Rhythm:regular Rate:Normal  Echo 06/02/15 with normal EF, no sig valve abnormalities  Carotid duplex: complete right occlusion, >80% left ICA stenosis, occlusion and stenosis of bilateral vertebral arteries as well   Neuro/Psych Recent stroke with right sided weakness. "Almost back to normal" per patient. CVA, Residual Symptoms    GI/Hepatic negative GI ROS, Neg liver ROS, (+)     substance abuse  alcohol use,   Endo/Other  negative endocrine ROS  Renal/GU negative Renal ROS     Musculoskeletal  (+) Arthritis ,   Abdominal   Peds  Hematology negative hematology ROS (+)   Anesthesia Other Findings   Reproductive/Obstetrics negative OB ROS                          Anesthesia Physical Anesthesia Plan  ASA: IV  Anesthesia Plan: General   Post-op Pain Management:    Induction: Intravenous  Airway Management Planned: Oral ETT  Additional Equipment: Arterial line  Intra-op Plan:   Post-operative Plan: Extubation in OR  Informed Consent: I have reviewed the patients History and Physical, chart, labs and discussed the procedure including the risks, benefits and alternatives for the proposed anesthesia with the patient or authorized  representative who has indicated his/her understanding and acceptance.   Dental Advisory Given  Plan Discussed with: Anesthesiologist, CRNA and Surgeon  Anesthesia Plan Comments: (Patient with significant carotid and vertebral artery atherosclerosis disease, normal echo  Will keep blood pressure at baseline, GA with ETT and A line, plan to shunt carotid while clamped, currently with permissive hypertension and triple antiplatelet therapy)      Anesthesia Quick Evaluation

## 2015-06-07 ENCOUNTER — Telehealth: Payer: Self-pay | Admitting: Vascular Surgery

## 2015-06-07 ENCOUNTER — Encounter (HOSPITAL_COMMUNITY): Payer: Self-pay | Admitting: Vascular Surgery

## 2015-06-07 LAB — BASIC METABOLIC PANEL
ANION GAP: 5 (ref 5–15)
BUN: 10 mg/dL (ref 6–20)
CALCIUM: 8.9 mg/dL (ref 8.9–10.3)
CO2: 26 mmol/L (ref 22–32)
Chloride: 103 mmol/L (ref 101–111)
Creatinine, Ser: 1.02 mg/dL (ref 0.61–1.24)
GLUCOSE: 118 mg/dL — AB (ref 65–99)
POTASSIUM: 4.2 mmol/L (ref 3.5–5.1)
Sodium: 134 mmol/L — ABNORMAL LOW (ref 135–145)

## 2015-06-07 LAB — CBC
HEMATOCRIT: 32.3 % — AB (ref 39.0–52.0)
Hemoglobin: 10.9 g/dL — ABNORMAL LOW (ref 13.0–17.0)
MCH: 30.6 pg (ref 26.0–34.0)
MCHC: 33.7 g/dL (ref 30.0–36.0)
MCV: 90.7 fL (ref 78.0–100.0)
PLATELETS: 285 10*3/uL (ref 150–400)
RBC: 3.56 MIL/uL — AB (ref 4.22–5.81)
RDW: 12.6 % (ref 11.5–15.5)
WBC: 10.9 10*3/uL — AB (ref 4.0–10.5)

## 2015-06-07 LAB — SURGICAL PCR SCREEN
MRSA, PCR: NEGATIVE
STAPHYLOCOCCUS AUREUS: NEGATIVE

## 2015-06-07 MED ORDER — OXYCODONE HCL 5 MG PO TABS: 5.0000 mg | ORAL_TABLET | Freq: Four times a day (QID) | ORAL | Status: DC | PRN

## 2015-06-07 NOTE — Telephone Encounter (Signed)
No answer, no voicemail.

## 2015-06-07 NOTE — Progress Notes (Signed)
Subjective: Interval History: none.. Comfortable. Sitting up in chair. No difficulty swallowing.  Objective: Vital signs in last 24 hours: Temp:  [97.4 F (36.3 C)-98.9 F (37.2 C)] 98.8 F (37.1 C) (10/25 0308) Pulse Rate:  [54-85] 59 (10/25 0308) Resp:  [12-18] 12 (10/25 0308) BP: (104-145)/(57-81) 140/81 mmHg (10/25 0308) SpO2:  [94 %-100 %] 98 % (10/25 0308) Arterial Line BP: (107-198)/(48-72) 158/61 mmHg (10/25 0308)  Intake/Output from previous day: 10/24 0701 - 10/25 0700 In: 7322 [P.O.:1320; I.V.:3050; IV Piggyback:50] Out: 0254 [Urine:1425; Blood:200] Intake/Output this shift:    No neck hematoma. Neurologically intact. I cannot detect any weakness in his right versus left arm.  Lab Results:  Recent Labs  06/06/15 1745 06/07/15 0320  WBC 10.7* 10.9*  HGB 11.3* 10.9*  HCT 33.4* 32.3*  PLT 296 285   BMET  Recent Labs  06/06/15 1745 06/07/15 0320  NA  --  134*  K  --  4.2  CL  --  103  CO2  --  26  GLUCOSE  --  118*  BUN  --  10  CREATININE 0.95 1.02  CALCIUM  --  8.9    Studies/Results: Dg Chest 2 View  06/01/2015  CLINICAL DATA:  Stroke symptoms, RIGHT-side numbness and weakness, hypertension, history carotid arterial occlusion EXAM: CHEST  2 VIEW COMPARISON:  None FINDINGS: Normal heart size, mediastinal contours, and pulmonary vascularity. Emphysematous changes without infiltrate, pleural effusion or pneumothorax. Bones unremarkable. IMPRESSION: COPD changes. No acute abnormalities. Electronically Signed   By: Lavonia Dana M.D.   On: 06/01/2015 21:08   Ct Head Wo Contrast  06/01/2015  CLINICAL DATA:  Right arm weakness for 1 week. Decreased grip strength. Dizziness. Possible stroke. EXAM: CT HEAD WITHOUT CONTRAST TECHNIQUE: Contiguous axial images were obtained from the base of the skull through the vertex without intravenous contrast. COMPARISON:  None. FINDINGS: There is no evidence of acute large territory infarct, intracranial hemorrhage, mass,  midline shift, or extra-axial fluid collection. Mild generalized cerebral atrophy is within normal limits for age. There is a subcentimeter subacute to chronic cortical infarct in the right precentral gyrus. There is a subcentimeter subacute to chronic subcortical white matter infarct in the high left frontal lobe. Periventricular white-matter hypodensities are nonspecific but compatible with mild chronic small vessel ischemic disease. Prior bilateral cataract extraction is noted. There is evidence of chronic left maxillary sinusitis, currently with minimal mucosal thickening in a small amount of secretions present. Trace fluid is present in the right sphenoid sinus, and there is moderate left frontal sinus and mild bilateral ethmoid air cell mucosal thickening. Mastoid air cells are clear. Calcified atherosclerosis is noted at the skull base. IMPRESSION: 1. No evidence of large territory infarct or intracranial hemorrhage. No mass effect. 2. Mild chronic small vessel ischemic disease in the cerebral white matter. 3. Subcentimeter, subacute to chronic infarcts in the high frontal lobes. Electronically Signed   By: Logan Bores M.D.   On: 06/01/2015 14:20   Mr Jodene Nam Head Wo Contrast  06/01/2015  CLINICAL DATA:  Progressive weakness in the right arm over the last week. Unable but PEG up coffee this morning. Antrum and headache. Hypertension. Carotid bruits. EXAM: MRI HEAD WITHOUT AND WITH CONTRAST MRA HEAD WITHOUT CONTRAST MRA NECK WITHOUT AND WITH CONTRAST TECHNIQUE: Multiplanar, multiecho pulse sequences of the brain and surrounding structures were obtained without and with intravenous contrast. Angiographic images of the Circle of Willis were obtained using MRA technique without intravenous contrast. Angiographic images of the neck were  obtained using MRA technique without and with intravenous contrast. Carotid stenosis measurements (when applicable) are obtained utilizing NASCET criteria, using the distal  internal carotid diameter as the denominator. CONTRAST:  74mL MULTIHANCE GADOBENATE DIMEGLUMINE 529 MG/ML IV SOLN COMPARISON:  CT head from the same day. FINDINGS: MRI HEAD FINDINGS The diffusion-weighted images confirm scattered foci of acute/ subacute infarct and both cerebral hemispheres. These cross a watershed distribution and a more prominent left than right. Anteriorly and superiorly the infarcts are similar on both sides. The areas of infarction extend more posteriorly on the left extending to the border of the left temporal and occipital lobes. Cortical and subcortical T2 hyperintensities correspond with the areas of restricted diffusion. Mild atrophy and periventricular white matter changes are noted bilaterally as well. T2 changes extend along the cortical spinal tract on the right, likely related to remote ischemic injury. T2 changes extend into the brainstem. There is no flow in the right internal carotid artery at the skullbase. Flow is present in the posterior circulation. Flow is present in the left internal carotid artery and bilateral MCA vessels. No significant extra-axial fluid collection is present. Bilateral lens replacements are noted. The globes and orbits are otherwise within normal limits bilaterally. Mild mucosal thickening is evident throughout the paranasal sinuses minimal fluid is present in the left maxillary sinus and right sphenoid sinus. There is more pronounced mucosal thickening in the left frontal sinus. The mastoid air cells are clear. MRA HEAD FINDINGS The right internal carotid artery is occluded. The left internal carotid artery is unremarkable from the high cervical segments through the ICA termini. The anterior communicating artery is patent. The left A1 and M1 segments are intact. The left MCA bifurcation is within normal limits. There is mild distal small vessel irregularity. There is slight decreased intensity of signal in the right A2 segments. There is significant  attenuation of signal within the right A1 and M1 segments. That significantly affects the right MCA branch vessels as well. The left vertebral artery is the dominant vessel. There is a high-grade stenosis or occlusion of the right vertebral artery. The right AICA is dominant. The basilar artery is small. The right posterior cerebral artery originates from the basilar tip. The left posterior cerebral artery is of fetal type. Better signal is present in the right than left PCA branch vessels. MRA NECK FINDINGS Time-of-flight images demonstrate signal loss compatible with a high-grade stenosis at the left carotid bifurcation. The right internal carotid artery is occluded just beyond the bifurcation. Flow is antegrade in the left vertebral artery. The right vertebral artery is not visualized. The postcontrast images demonstrate a moderate stenosis at the origin of the left common carotid artery. There is a high-grade stenosis of the distal right common carotid artery. The right internal carotid artery is occluded 8 mm beyond the bifurcation. There is signal loss compatible with a high-grade stenosis in the proximal left internal and external carotid arteries. No distal stenoses are present. There signal loss at the origin of the left vertebra artery suggesting moderate to high-grade stenosis. Mild tortuosity is present in the cervical left vertebral artery. There is no significant stenosis in the neck or intracranial. The right vertebral artery is hypoplastic with segmental narrowing beginning at the dural margin and inclusion proximal to the vertebrobasilar junction. IMPRESSION: 1. Bilateral watershed territory infarct, more prominent left than right. 2. 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 3. Attenuated flow in the right M1 segment with  flow to the right via a patent anterior communicating artery comment from the left. 4. Asymmetric attenuation of flow into  the right MCA branch vessels. 5. High-grade stenosis of the proximal left internal carotid artery. The left internal carotid artery feeds the entire anterior circulation and the left posterior circulation. 6. Moderate to high-grade proximal left vertebral artery stenosis. 7. High-grade stenosis and occlusion of the hypoplastic distal right vertebral artery. The right vertebral artery is not well seen in the neck and likely occluded proximally and reconstituted distally. 8. Fetal type left posterior cerebral artery. 9. Additional atrophy and white matter changes are noted in the brain. Electronically Signed   By: San Morelle M.D.   On: 06/01/2015 18:04   Mr Angiogram Neck W Wo Contrast  06/01/2015  CLINICAL DATA:  Progressive weakness in the right arm over the last week. Unable but PEG up coffee this morning. Antrum and headache. Hypertension. Carotid bruits. EXAM: MRI HEAD WITHOUT AND WITH CONTRAST MRA HEAD WITHOUT CONTRAST MRA NECK WITHOUT AND WITH CONTRAST TECHNIQUE: Multiplanar, multiecho pulse sequences of the brain and surrounding structures were obtained without and with intravenous contrast. Angiographic images of the Circle of Willis were obtained using MRA technique without intravenous contrast. Angiographic images of the neck were obtained using MRA technique without and with intravenous contrast. Carotid stenosis measurements (when applicable) are obtained utilizing NASCET criteria, using the distal internal carotid diameter as the denominator. CONTRAST:  63mL MULTIHANCE GADOBENATE DIMEGLUMINE 529 MG/ML IV SOLN COMPARISON:  CT head from the same day. FINDINGS: MRI HEAD FINDINGS The diffusion-weighted images confirm scattered foci of acute/ subacute infarct and both cerebral hemispheres. These cross a watershed distribution and a more prominent left than right. Anteriorly and superiorly the infarcts are similar on both sides. The areas of infarction extend more posteriorly on the left  extending to the border of the left temporal and occipital lobes. Cortical and subcortical T2 hyperintensities correspond with the areas of restricted diffusion. Mild atrophy and periventricular white matter changes are noted bilaterally as well. T2 changes extend along the cortical spinal tract on the right, likely related to remote ischemic injury. T2 changes extend into the brainstem. There is no flow in the right internal carotid artery at the skullbase. Flow is present in the posterior circulation. Flow is present in the left internal carotid artery and bilateral MCA vessels. No significant extra-axial fluid collection is present. Bilateral lens replacements are noted. The globes and orbits are otherwise within normal limits bilaterally. Mild mucosal thickening is evident throughout the paranasal sinuses minimal fluid is present in the left maxillary sinus and right sphenoid sinus. There is more pronounced mucosal thickening in the left frontal sinus. The mastoid air cells are clear. MRA HEAD FINDINGS The right internal carotid artery is occluded. The left internal carotid artery is unremarkable from the high cervical segments through the ICA termini. The anterior communicating artery is patent. The left A1 and M1 segments are intact. The left MCA bifurcation is within normal limits. There is mild distal small vessel irregularity. There is slight decreased intensity of signal in the right A2 segments. There is significant attenuation of signal within the right A1 and M1 segments. That significantly affects the right MCA branch vessels as well. The left vertebral artery is the dominant vessel. There is a high-grade stenosis or occlusion of the right vertebral artery. The right AICA is dominant. The basilar artery is small. The right posterior cerebral artery originates from the basilar tip. The left posterior cerebral artery  is of fetal type. Better signal is present in the right than left PCA branch vessels.  MRA NECK FINDINGS Time-of-flight images demonstrate signal loss compatible with a high-grade stenosis at the left carotid bifurcation. The right internal carotid artery is occluded just beyond the bifurcation. Flow is antegrade in the left vertebral artery. The right vertebral artery is not visualized. The postcontrast images demonstrate a moderate stenosis at the origin of the left common carotid artery. There is a high-grade stenosis of the distal right common carotid artery. The right internal carotid artery is occluded 8 mm beyond the bifurcation. There is signal loss compatible with a high-grade stenosis in the proximal left internal and external carotid arteries. No distal stenoses are present. There signal loss at the origin of the left vertebra artery suggesting moderate to high-grade stenosis. Mild tortuosity is present in the cervical left vertebral artery. There is no significant stenosis in the neck or intracranial. The right vertebral artery is hypoplastic with segmental narrowing beginning at the dural margin and inclusion proximal to the vertebrobasilar junction. IMPRESSION: 1. Bilateral watershed territory infarct, more prominent left than right. 2. 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 3. Attenuated flow in the right M1 segment with flow to the right via a patent anterior communicating artery comment from the left. 4. Asymmetric attenuation of flow into the right MCA branch vessels. 5. High-grade stenosis of the proximal left internal carotid artery. The left internal carotid artery feeds the entire anterior circulation and the left posterior circulation. 6. Moderate to high-grade proximal left vertebral artery stenosis. 7. High-grade stenosis and occlusion of the hypoplastic distal right vertebral artery. The right vertebral artery is not well seen in the neck and likely occluded proximally and reconstituted distally. 8. Fetal type left  posterior cerebral artery. 9. Additional atrophy and white matter changes are noted in the brain. Electronically Signed   By: San Morelle M.D.   On: 06/01/2015 18:04   Mr Brain Wo Contrast  06/01/2015  CLINICAL DATA:  Progressive weakness in the right arm over the last week. Unable but PEG up coffee this morning. Antrum and headache. Hypertension. Carotid bruits. EXAM: MRI HEAD WITHOUT AND WITH CONTRAST MRA HEAD WITHOUT CONTRAST MRA NECK WITHOUT AND WITH CONTRAST TECHNIQUE: Multiplanar, multiecho pulse sequences of the brain and surrounding structures were obtained without and with intravenous contrast. Angiographic images of the Circle of Willis were obtained using MRA technique without intravenous contrast. Angiographic images of the neck were obtained using MRA technique without and with intravenous contrast. Carotid stenosis measurements (when applicable) are obtained utilizing NASCET criteria, using the distal internal carotid diameter as the denominator. CONTRAST:  57mL MULTIHANCE GADOBENATE DIMEGLUMINE 529 MG/ML IV SOLN COMPARISON:  CT head from the same day. FINDINGS: MRI HEAD FINDINGS The diffusion-weighted images confirm scattered foci of acute/ subacute infarct and both cerebral hemispheres. These cross a watershed distribution and a more prominent left than right. Anteriorly and superiorly the infarcts are similar on both sides. The areas of infarction extend more posteriorly on the left extending to the border of the left temporal and occipital lobes. Cortical and subcortical T2 hyperintensities correspond with the areas of restricted diffusion. Mild atrophy and periventricular white matter changes are noted bilaterally as well. T2 changes extend along the cortical spinal tract on the right, likely related to remote ischemic injury. T2 changes extend into the brainstem. There is no flow in the right internal carotid artery at the skullbase. Flow is present  in the posterior circulation. Flow  is present in the left internal carotid artery and bilateral MCA vessels. No significant extra-axial fluid collection is present. Bilateral lens replacements are noted. The globes and orbits are otherwise within normal limits bilaterally. Mild mucosal thickening is evident throughout the paranasal sinuses minimal fluid is present in the left maxillary sinus and right sphenoid sinus. There is more pronounced mucosal thickening in the left frontal sinus. The mastoid air cells are clear. MRA HEAD FINDINGS The right internal carotid artery is occluded. The left internal carotid artery is unremarkable from the high cervical segments through the ICA termini. The anterior communicating artery is patent. The left A1 and M1 segments are intact. The left MCA bifurcation is within normal limits. There is mild distal small vessel irregularity. There is slight decreased intensity of signal in the right A2 segments. There is significant attenuation of signal within the right A1 and M1 segments. That significantly affects the right MCA branch vessels as well. The left vertebral artery is the dominant vessel. There is a high-grade stenosis or occlusion of the right vertebral artery. The right AICA is dominant. The basilar artery is small. The right posterior cerebral artery originates from the basilar tip. The left posterior cerebral artery is of fetal type. Better signal is present in the right than left PCA branch vessels. MRA NECK FINDINGS Time-of-flight images demonstrate signal loss compatible with a high-grade stenosis at the left carotid bifurcation. The right internal carotid artery is occluded just beyond the bifurcation. Flow is antegrade in the left vertebral artery. The right vertebral artery is not visualized. The postcontrast images demonstrate a moderate stenosis at the origin of the left common carotid artery. There is a high-grade stenosis of the distal right common carotid artery. The right internal carotid artery  is occluded 8 mm beyond the bifurcation. There is signal loss compatible with a high-grade stenosis in the proximal left internal and external carotid arteries. No distal stenoses are present. There signal loss at the origin of the left vertebra artery suggesting moderate to high-grade stenosis. Mild tortuosity is present in the cervical left vertebral artery. There is no significant stenosis in the neck or intracranial. The right vertebral artery is hypoplastic with segmental narrowing beginning at the dural margin and inclusion proximal to the vertebrobasilar junction. IMPRESSION: 1. Bilateral watershed territory infarct, more prominent left than right. 2. 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 3. Attenuated flow in the right M1 segment with flow to the right via a patent anterior communicating artery comment from the left. 4. Asymmetric attenuation of flow into the right MCA branch vessels. 5. High-grade stenosis of the proximal left internal carotid artery. The left internal carotid artery feeds the entire anterior circulation and the left posterior circulation. 6. Moderate to high-grade proximal left vertebral artery stenosis. 7. High-grade stenosis and occlusion of the hypoplastic distal right vertebral artery. The right vertebral artery is not well seen in the neck and likely occluded proximally and reconstituted distally. 8. Fetal type left posterior cerebral artery. 9. Additional atrophy and white matter changes are noted in the brain. Electronically Signed   By: San Morelle M.D.   On: 06/01/2015 18:04   Anti-infectives: Anti-infectives    Start     Dose/Rate Route Frequency Ordered Stop   06/06/15 2000  cefUROXime (ZINACEF) 1.5 g in dextrose 5 % 50 mL IVPB     1.5 g 100 mL/hr over 30 Minutes Intravenous Every 12 hours  06/06/15 1545 06/07/15 1959   06/05/15 1453  cefUROXime (ZINACEF) 1.5 g in dextrose 5 % 50 mL IVPB     1.5 g 100  mL/hr over 30 Minutes Intravenous 30 min pre-op 06/05/15 1453 06/06/15 0758      Assessment/Plan: s/p Procedure(s): LEFT  CAROTID ARTERY ENDARTERECTOMY (Left) WITH HEMASHIELD PATCH ANGIOPLASTY (Left) Table overall. Has voided without difficulty. Should be okay for discharge later today after breakfast. We'll see him in the office in 3 weeks   LOS: 1 day   Early, Todd 06/07/2015, 7:13 AM

## 2015-06-07 NOTE — Telephone Encounter (Signed)
-----   Message from Mena Goes, RN sent at 06/07/2015 10:10 AM EDT ----- Regarding: schedule   ----- Message -----    From: Alvia Grove, PA-C    Sent: 06/07/2015   7:21 AM      To: [facility] Charge Pool  S/p left CEA 06/06/15  F/u with Dr. Donnetta Hutching in 3 weeks  Thanks Maudie Mercury

## 2015-06-07 NOTE — Progress Notes (Signed)
Pt given discharge packet. Medications reviewed and pt educated on care of incision. Pt states no further questions at this time.

## 2015-06-09 NOTE — Discharge Summary (Signed)
[facility] Discharge Summary  OSMIN WELZ Feb 06, 1951 64 y.o. male  [id number]  Admission Date: 06/06/2015  Discharge Date: 06/07/2015  Physician: Curt Jews, MD  Admission Diagnosis: Left carotid artery stenosis I65.22  HPI:   This is a 64 y.o. male who has been seen by Dr. Donnetta Hutching in the past for carotid artery stenosis. He was last seen in December 2015 and at that time, it was noted that previous duplex suggested right ICA occlusion. The duplex in the office suggested a high grade stenosis and a CTA of the neck was ordered. This revealed complete versus near complete occlusion of the proximal right ICA 1cm beyond its origin with minimal distal flow. There was extensive atherosclerosis at the left carotid bifurcation with severe left ECA origin stenosis and 55% left ICA stenosis. 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 Given those results, he was to return in 1 year for repeat duplex given the left was only 55% stenosed.   He presented to the hospital yesterday with complaints of right arm weakness for about a week. He states that his right arm is clumbsy. He denies any vision changes, right leg weakness, speech difficulties or left sided weakness.   He continues to smoke ~ 1 ppd of cigarettes and states he drinks 4-5 beers/day.   He takes an ACEI for hypertension. He was not on an aspirin, but has been placed on daily aspirin during admission.   Hospital Course:  The patient was admitted to the hospital and taken to the operating room on 06/06/2015 and underwent left carotid endarterectomy.  The patient tolerated the procedure well and was transported to the PACU in stable condition.  By POD 1, the patient's neuro status was intact. His left neck was without hematoma. He was ambulating, voiding and tolerating a diet without difficulty. He was discharge home on POD 1 in good condition.     Recent Labs  06/07/15 0320  NA 134*  K 4.2    CL 103  CO2 26  GLUCOSE 118*  BUN 10  CALCIUM 8.9    Recent Labs  06/06/15 1745 06/07/15 0320  WBC 10.7* 10.9*  HGB 11.3* 10.9*  HCT 33.4* 32.3*  PLT 296 285   No results for input(s): INR in the last 72 hours.  Discharge Instructions:   The patient is discharged to home with extensive instructions on wound care and progressive ambulation.  They are instructed not to drive or perform any heavy lifting until returning to see the physician in his office.  Discharge Instructions    Call MD for:  redness, tenderness, or signs of infection (pain, swelling, bleeding, redness, odor or green/yellow discharge around incision site)    Complete by:  As directed      Call MD for:  severe or increased pain, loss or decreased feeling  in affected limb(s)    Complete by:  As directed      Call MD for:  temperature >100.5    Complete by:  As directed      Care order/instruction    Complete by:  As directed   Call MD for difficulty swallowing or speaking; weakness in arms or legs that is a new symtom; severe headache.  If you have increased swelling in the neck and/or  are having difficulty breathing, CALL [phone]     Discharge wound care:    Complete by:  As directed   You may shower tomorrow and wash your neck wound daily with soap  and water and pat dry. You may wash over the strips on your incision. Do not peel the strips off. They will eventually fall off.     Driving Restrictions    Complete by:  As directed   No driving for 2 weeks     Increase activity slowly    Complete by:  As directed   Walk with assistance use walker or cane as needed     Lifting restrictions    Complete by:  As directed   No lifting for 2 weeks     Resume previous diet    Complete by:  As directed            Discharge Diagnosis:  Left carotid artery stenosis I65.22  Secondary Diagnosis: Patient Active Problem List   Diagnosis Date Noted  . Symptomatic stenosis of left carotid artery 06/06/2015  . CVA  (cerebral infarction) 06/01/2015  . Acute CVA (cerebrovascular accident) ([place]) 06/01/2015  . Alcohol dependence ([place]) 06/01/2015  . Tobacco abuse disorder 06/01/2015  . Hyponatremia 06/01/2015  . Hypertension 06/01/2015  . Bilateral carotid artery stenosis 06/01/2015  . Cannabis abuse, episodic 06/01/2015  . Stroke with cerebral ischemia ([place])   . Occlusion and stenosis of carotid artery without mention of cerebral infarction 12/01/2013   Past Medical History  Diagnosis Date  . Hypertension   . Carotid artery occlusion   . Arthritis       Medication List    TAKE these medications        ALEVE 220 MG tablet  Generic drug:  naproxen sodium  Take 440 mg by mouth 2 (two) times daily as needed (pain).     aspirin 81 MG EC tablet  Take 1 tablet (81 mg total) by mouth daily.     atorvastatin 10 MG tablet  Commonly known as:  LIPITOR  Take 1 tablet (10 mg total) by mouth daily at 6 PM.     clopidogrel 75 MG tablet  Commonly known as:  PLAVIX  Take 1 tablet (75 mg total) by mouth daily.     clopidogrel 75 MG tablet  Commonly known as:  PLAVIX  Take 1 tablet (75 mg total) by mouth daily.     folic acid 1 MG tablet  Commonly known as:  FOLVITE  Take 1 tablet (1 mg total) by mouth daily.     multivitamin with minerals Tabs tablet  Take 1 tablet by mouth daily.     nicotine 21 mg/24hr patch  Commonly known as:  NICODERM CQ - dosed in mg/24 hours  Place 1 patch (21 mg total) onto the skin daily.     oxyCODONE 5 MG immediate release tablet  Commonly known as:  Oxy IR/ROXICODONE  Take 1-2 tablets (5-10 mg total) by mouth every 6 (six) hours as needed for moderate pain.     thiamine 100 MG tablet  Take 1 tablet (100 mg total) by mouth daily.        Oxycodone #20 No Refill  Disposition: Home  Patient's condition: is Good  Follow up: 1. Dr.  Donnetta Hutching in 3 weeks.   Virgina Jock, PA-C [facility] [phone]  --- For [facility] use  --- Instructions: Press F2 to tab through selections.  Delete question if not applicable.   Modified Rankin score at D/C (0-6): 0  IV medication needed for:  1. Hypertension: No 2. Hypotension: No  Post-op Complications: No  1. Post-op CVA or TIA: No  2. CN injury: No  3. Myocardial infarction: No  4.  CHF: No  5.  Dysrhythmia (new): No  6. Wound infection: No  7. Reperfusion symptoms: No  8. Return to OR: No  Discharge medications: Statin use:  Yes If No: [ ]  For Medical reasons, [ ]  Non-compliant, [ ]  Not-indicated ASA use:  Yes  If No: [ ]  For Medical reasons, [ ]  Non-compliant, [ ]  Not-indicated Beta blocker use:  No If No: [ ]  For Medical reasons, [ ]  Non-compliant, [x ] Not-indicated ACE-Inhibitor use:  No If No: [ ]  For Medical reasons, [ ]  Non-compliant, [x]  Not-indicated P2Y12 Antagonist use: Yes, [x ] Plavix, [ ]  Plasugrel, [ ]  Ticlopinine, [ ]  Ticagrelor, [ ]  Other, [ ]  No for medical reason, [ ]  Non-compliant, [ ]  Not-indicated Anti-coagulant use:  No, [ ]  Warfarin, [ ]  Rivaroxaban, [ ]  Dabigatran, [ ]  Other, [ ]  No for medical reason, [ ]  Non-compliant, [ x] Not-indicated

## 2015-06-23 ENCOUNTER — Encounter: Payer: Self-pay | Admitting: Vascular Surgery

## 2015-06-28 ENCOUNTER — Encounter: Payer: Self-pay | Admitting: Vascular Surgery

## 2015-06-28 ENCOUNTER — Ambulatory Visit (INDEPENDENT_AMBULATORY_CARE_PROVIDER_SITE_OTHER): Payer: Commercial Managed Care - HMO | Admitting: Vascular Surgery

## 2015-06-28 VITALS — BP 135/68 | HR 66 | Temp 98.4°F | Ht 69.0 in | Wt 181.0 lb

## 2015-06-28 DIAGNOSIS — I6523 Occlusion and stenosis of bilateral carotid arteries: Secondary | ICD-10-CM

## 2015-06-28 NOTE — Progress Notes (Signed)
   Patient name: Connor Butler MRN: [id number] DOB: [birth year] Sex: male  REASON FOR VISIT: Here today for follow-up of left carotid endarterectomy for symptomatic carotid disease on in 2416  HPI: Connor Butler is a 64 y.o. male had the left carotid endarterectomy for symptomatic carotid disease. He had a preoperative stroke leaving him with right arm weakness. He had been followed with known occluded right and moderate left stenosis. He probably had plaque hemorrhage and progressed 80 and 90% symptomatic stenosis. He underwent uneventful endarterectomy. He continues to improve. He reports he sees progress daily regarding his right hand is now able to write.  Current Outpatient Prescriptions  Medication Sig Dispense Refill  . aspirin EC 81 MG EC tablet Take 1 tablet (81 mg total) by mouth daily. 30 tablet 0  . atorvastatin (LIPITOR) 10 MG tablet Take 1 tablet (10 mg total) by mouth daily at 6 PM. 30 tablet 0  . clopidogrel (PLAVIX) 75 MG tablet Take 1 tablet (75 mg total) by mouth daily. 30 tablet 0  . folic acid (FOLVITE) 1 MG tablet Take 1 tablet (1 mg total) by mouth daily. 30 tablet 0  . Multiple Vitamin (MULTIVITAMIN WITH MINERALS) TABS tablet Take 1 tablet by mouth daily. 30 tablet 0  . naproxen sodium (ALEVE) 220 MG tablet Take 440 mg by mouth 2 (two) times daily as needed (pain).    . nicotine (NICODERM CQ - DOSED IN MG/24 HOURS) 21 mg/24hr patch Place 1 patch (21 mg total) onto the skin daily. 28 patch 0  . oxyCODONE (OXY IR/ROXICODONE) 5 MG immediate release tablet Take 1-2 tablets (5-10 mg total) by mouth every 6 (six) hours as needed for moderate pain. 20 tablet 0  . thiamine 100 MG tablet Take 1 tablet (100 mg total) by mouth daily. 30 tablet 0  . clopidogrel (PLAVIX) 75 MG tablet Take 1 tablet (75 mg total) by mouth daily. (Patient not taking: Reported on 06/03/2015) 30 tablet 0   No current facility-administered medications for this visit.       PHYSICAL EXAM: Filed  Vitals:   06/28/15 0825 06/28/15 0826  BP: 143/74 135/68  Pulse: 66   Temp:  98.4 F (36.9 C)  TempSrc:  Oral  Height: 5\' 9"  (1.753 m)   Weight: 181 lb (82.101 kg)   SpO2: 100%     GENERAL: The patient is a well-nourished male, in no acute distress. The vital signs are documented above. CARDIOVASCULAR: There is a regular rate and rhythm. PULMONARY: There is good air exchange bilaterally without wheezing or rales. Neurologically he is grossly intact. His left neck incision is well-healed and he has no bruits bilaterally  MEDICAL ISSUES: Stable status post left endarterectomy for symptomatic disease on 06/06/2015. He will fortunately has quit smoking since surgery. He was certainly congratulated on this and again stress that this is the most important thing he can do for his health maintenance. We will see him again in 6 months with repeat carotid duplex. He will notify us for any wound issues or stroke symptoms  Azelia Reiger [facility] [employer]: [phone]

## 2015-06-28 NOTE — Addendum Note (Signed)
Addended by: Dorthula Rue L on: 06/28/2015 04:03 PM   Modules accepted: Orders

## 2015-07-01 DIAGNOSIS — I10 Essential (primary) hypertension: Secondary | ICD-10-CM | POA: Diagnosis not present

## 2015-07-01 DIAGNOSIS — I779 Disorder of arteries and arterioles, unspecified: Secondary | ICD-10-CM | POA: Diagnosis not present

## 2015-07-01 DIAGNOSIS — G479 Sleep disorder, unspecified: Secondary | ICD-10-CM | POA: Diagnosis not present

## 2015-07-01 DIAGNOSIS — I6503 Occlusion and stenosis of bilateral vertebral arteries: Secondary | ICD-10-CM | POA: Diagnosis not present

## 2015-07-01 DIAGNOSIS — I6930 Unspecified sequelae of cerebral infarction: Secondary | ICD-10-CM | POA: Diagnosis not present

## 2015-07-01 DIAGNOSIS — I6521 Occlusion and stenosis of right carotid artery: Secondary | ICD-10-CM | POA: Diagnosis not present

## 2015-07-01 DIAGNOSIS — I639 Cerebral infarction, unspecified: Secondary | ICD-10-CM | POA: Diagnosis not present

## 2015-07-15 DIAGNOSIS — I10 Essential (primary) hypertension: Secondary | ICD-10-CM | POA: Diagnosis not present

## 2015-07-15 DIAGNOSIS — L299 Pruritus, unspecified: Secondary | ICD-10-CM | POA: Diagnosis not present

## 2015-07-15 DIAGNOSIS — I779 Disorder of arteries and arterioles, unspecified: Secondary | ICD-10-CM | POA: Diagnosis not present

## 2015-07-15 DIAGNOSIS — E7800 Pure hypercholesterolemia, unspecified: Secondary | ICD-10-CM | POA: Diagnosis not present

## 2015-08-02 ENCOUNTER — Encounter (HOSPITAL_COMMUNITY): Payer: Self-pay

## 2015-08-02 ENCOUNTER — Ambulatory Visit: Payer: Medicare Other | Admitting: Vascular Surgery

## 2015-08-02 DIAGNOSIS — R6889 Other general symptoms and signs: Secondary | ICD-10-CM | POA: Diagnosis not present

## 2015-10-20 DIAGNOSIS — I10 Essential (primary) hypertension: Secondary | ICD-10-CM | POA: Diagnosis not present

## 2015-10-20 DIAGNOSIS — I6503 Occlusion and stenosis of bilateral vertebral arteries: Secondary | ICD-10-CM | POA: Diagnosis not present

## 2015-10-20 DIAGNOSIS — I6930 Unspecified sequelae of cerebral infarction: Secondary | ICD-10-CM | POA: Diagnosis not present

## 2015-10-20 DIAGNOSIS — I779 Disorder of arteries and arterioles, unspecified: Secondary | ICD-10-CM | POA: Diagnosis not present

## 2015-10-20 DIAGNOSIS — Z0000 Encounter for general adult medical examination without abnormal findings: Secondary | ICD-10-CM | POA: Diagnosis not present

## 2015-10-20 DIAGNOSIS — R6889 Other general symptoms and signs: Secondary | ICD-10-CM | POA: Diagnosis not present

## 2015-10-20 DIAGNOSIS — I639 Cerebral infarction, unspecified: Secondary | ICD-10-CM | POA: Diagnosis not present

## 2015-10-20 DIAGNOSIS — E7800 Pure hypercholesterolemia, unspecified: Secondary | ICD-10-CM | POA: Diagnosis not present

## 2015-10-20 DIAGNOSIS — Z720 Tobacco use: Secondary | ICD-10-CM | POA: Diagnosis not present

## 2015-10-20 DIAGNOSIS — Z1389 Encounter for screening for other disorder: Secondary | ICD-10-CM | POA: Diagnosis not present

## 2015-10-20 DIAGNOSIS — Z23 Encounter for immunization: Secondary | ICD-10-CM | POA: Diagnosis not present

## 2015-11-10 ENCOUNTER — Encounter: Payer: Self-pay | Admitting: Family Medicine

## 2015-11-15 DIAGNOSIS — R6889 Other general symptoms and signs: Secondary | ICD-10-CM | POA: Diagnosis not present

## 2015-11-16 DIAGNOSIS — M533 Sacrococcygeal disorders, not elsewhere classified: Secondary | ICD-10-CM | POA: Diagnosis not present

## 2015-11-16 DIAGNOSIS — M79641 Pain in right hand: Secondary | ICD-10-CM | POA: Diagnosis not present

## 2015-11-16 DIAGNOSIS — M25551 Pain in right hip: Secondary | ICD-10-CM | POA: Diagnosis not present

## 2015-11-16 DIAGNOSIS — M25561 Pain in right knee: Secondary | ICD-10-CM | POA: Diagnosis not present

## 2015-11-16 DIAGNOSIS — M19041 Primary osteoarthritis, right hand: Secondary | ICD-10-CM | POA: Diagnosis not present

## 2015-11-16 DIAGNOSIS — R6889 Other general symptoms and signs: Secondary | ICD-10-CM | POA: Diagnosis not present

## 2015-11-16 DIAGNOSIS — M064 Inflammatory polyarthropathy: Secondary | ICD-10-CM | POA: Diagnosis not present

## 2015-11-16 DIAGNOSIS — M79671 Pain in right foot: Secondary | ICD-10-CM | POA: Diagnosis not present

## 2015-11-16 DIAGNOSIS — M545 Low back pain: Secondary | ICD-10-CM | POA: Diagnosis not present

## 2015-12-06 ENCOUNTER — Other Ambulatory Visit (HOSPITAL_COMMUNITY): Payer: Self-pay | Admitting: Rheumatology

## 2015-12-06 DIAGNOSIS — G8929 Other chronic pain: Secondary | ICD-10-CM

## 2015-12-06 DIAGNOSIS — M533 Sacrococcygeal disorders, not elsewhere classified: Principal | ICD-10-CM

## 2015-12-15 ENCOUNTER — Ambulatory Visit (HOSPITAL_COMMUNITY)
Admission: RE | Admit: 2015-12-15 | Discharge: 2015-12-15 | Disposition: A | Discharge status: Home / Self Care | Payer: Commercial Managed Care - HMO | Source: Ambulatory Visit | Attending: Rheumatology | Admitting: Rheumatology

## 2015-12-15 ENCOUNTER — Other Ambulatory Visit (HOSPITAL_COMMUNITY): Payer: Self-pay | Admitting: Rheumatology

## 2015-12-15 DIAGNOSIS — M87851 Other osteonecrosis, right femur: Secondary | ICD-10-CM | POA: Diagnosis not present

## 2015-12-15 DIAGNOSIS — M5137 Other intervertebral disc degeneration, lumbosacral region: Secondary | ICD-10-CM | POA: Diagnosis not present

## 2015-12-15 DIAGNOSIS — M533 Sacrococcygeal disorders, not elsewhere classified: Secondary | ICD-10-CM | POA: Diagnosis not present

## 2015-12-15 DIAGNOSIS — R6889 Other general symptoms and signs: Secondary | ICD-10-CM | POA: Diagnosis not present

## 2015-12-15 DIAGNOSIS — M4328 Fusion of spine, sacral and sacrococcygeal region: Secondary | ICD-10-CM | POA: Diagnosis not present

## 2015-12-15 DIAGNOSIS — M87852 Other osteonecrosis, left femur: Secondary | ICD-10-CM | POA: Diagnosis not present

## 2015-12-15 DIAGNOSIS — M25559 Pain in unspecified hip: Secondary | ICD-10-CM | POA: Diagnosis not present

## 2015-12-15 DIAGNOSIS — G8929 Other chronic pain: Secondary | ICD-10-CM

## 2015-12-19 DIAGNOSIS — M2550 Pain in unspecified joint: Secondary | ICD-10-CM | POA: Diagnosis not present

## 2015-12-19 DIAGNOSIS — R5381 Other malaise: Secondary | ICD-10-CM | POA: Diagnosis not present

## 2015-12-19 DIAGNOSIS — Z79899 Other long term (current) drug therapy: Secondary | ICD-10-CM | POA: Diagnosis not present

## 2015-12-19 LAB — CBC AND DIFFERENTIAL
HCT: 39 % — AB (ref 41–53)
Hemoglobin: 13 g/dL — AB (ref 13.5–17.5)
Platelets: 373 10*3/uL (ref 150–399)
WBC: 9.4 10*3/mL

## 2015-12-19 LAB — BASIC METABOLIC PANEL WITH GFR
BUN: 10 mg/dL (ref 4–21)
Creatinine: 0.9 mg/dL (ref 0.6–1.3)
Glucose: 96 mg/dL
Potassium: 3.9 mmol/L (ref 3.4–5.3)
Sodium: 137 mmol/L (ref 137–147)

## 2015-12-19 LAB — HEPATIC FUNCTION PANEL
ALT: 10 U/L (ref 10–40)
AST: 14 U/L (ref 14–40)
Alkaline Phosphatase: 65 U/L (ref 25–125)
Bilirubin, Total: 0.4 mg/dL

## 2015-12-21 DIAGNOSIS — M5137 Other intervertebral disc degeneration, lumbosacral region: Secondary | ICD-10-CM | POA: Diagnosis not present

## 2015-12-21 DIAGNOSIS — M533 Sacrococcygeal disorders, not elsewhere classified: Secondary | ICD-10-CM | POA: Diagnosis not present

## 2015-12-21 DIAGNOSIS — Z716 Tobacco abuse counseling: Secondary | ICD-10-CM | POA: Diagnosis not present

## 2015-12-21 DIAGNOSIS — M19241 Secondary osteoarthritis, right hand: Secondary | ICD-10-CM | POA: Diagnosis not present

## 2015-12-27 ENCOUNTER — Encounter (HOSPITAL_COMMUNITY): Payer: Commercial Managed Care - HMO

## 2015-12-27 ENCOUNTER — Ambulatory Visit: Payer: Commercial Managed Care - HMO | Admitting: Vascular Surgery

## 2015-12-27 ENCOUNTER — Encounter: Payer: Self-pay | Admitting: Vascular Surgery

## 2016-01-03 ENCOUNTER — Ambulatory Visit: Payer: Commercial Managed Care - HMO | Admitting: Vascular Surgery

## 2016-01-03 ENCOUNTER — Ambulatory Visit (HOSPITAL_COMMUNITY): Payer: Commercial Managed Care - HMO

## 2016-03-01 ENCOUNTER — Encounter: Payer: Self-pay | Admitting: Vascular Surgery

## 2016-03-01 ENCOUNTER — Ambulatory Visit (HOSPITAL_COMMUNITY)
Admission: RE | Admit: 2016-03-01 | Discharge: 2016-03-01 | Disposition: A | Discharge status: Home / Self Care | Payer: Commercial Managed Care - HMO | Source: Ambulatory Visit | Attending: Vascular Surgery | Admitting: Vascular Surgery

## 2016-03-01 DIAGNOSIS — I6523 Occlusion and stenosis of bilateral carotid arteries: Secondary | ICD-10-CM | POA: Diagnosis not present

## 2016-03-01 DIAGNOSIS — I10 Essential (primary) hypertension: Secondary | ICD-10-CM | POA: Diagnosis not present

## 2016-03-06 ENCOUNTER — Ambulatory Visit (INDEPENDENT_AMBULATORY_CARE_PROVIDER_SITE_OTHER): Payer: Commercial Managed Care - HMO | Admitting: Vascular Surgery

## 2016-03-06 ENCOUNTER — Encounter: Payer: Self-pay | Admitting: Vascular Surgery

## 2016-03-06 VITALS — BP 147/80 | HR 86 | Temp 98.8°F | Resp 16 | Ht 68.0 in | Wt 175.0 lb

## 2016-03-06 DIAGNOSIS — I6523 Occlusion and stenosis of bilateral carotid arteries: Secondary | ICD-10-CM | POA: Diagnosis not present

## 2016-03-06 NOTE — Progress Notes (Signed)
[facility] [place]  Patient name: Connor Butler MRN: [id number] DOB: 09/03/1950 Sex: male  REASON FOR VISIT: Follow-up extracranial cerebrovascular occlusive disease  HPI: Connor Butler is a 65 y.o. male status post right carotid endarterectomy for severe symptomatic left carotid stenosis on 06/06/2015. He continues to struggle with cigarette addiction. He is still trying to quit. No new neurologic deficits and is back to his baseline. He does report some hypersensitivity around his left neck incision.  Past Medical History:  Diagnosis Date  . Arthritis   . Carotid artery occlusion   . Hypertension     Family History  Problem Relation Age of Onset  . Stroke Father     SOCIAL HISTORY: Social History  Substance Use Topics  . Smoking status: Former Smoker    Packs/day: 0.20    Years: 45.00    Types: Cigarettes    Quit date: 06/01/2015  . Smokeless tobacco: Never Used  . Alcohol use 25.2 oz/week    42 Cans of beer per week    No Known Allergies  Current Outpatient Prescriptions  Medication Sig Dispense Refill  . naproxen sodium (ALEVE) 220 MG tablet Take 440 mg by mouth 2 (two) times daily as needed (pain).    Marland Kitchen aspirin EC 81 MG EC tablet Take 1 tablet (81 mg total) by mouth daily. 30 tablet 0  . atorvastatin (LIPITOR) 10 MG tablet Take 1 tablet (10 mg total) by mouth daily at 6 PM. 30 tablet 0  . clopidogrel (PLAVIX) 75 MG tablet Take 1 tablet (75 mg total) by mouth daily. (Patient not taking: Reported on 06/03/2015) 30 tablet 0  . folic acid (FOLVITE) 1 MG tablet Take 1 tablet (1 mg total) by mouth daily. (Patient not taking: Reported on 03/06/2016) 30 tablet 0  . Multiple Vitamin (MULTIVITAMIN WITH MINERALS) TABS tablet Take 1 tablet by mouth daily. (Patient not taking: Reported on 03/06/2016) 30 tablet 0  . thiamine 100 MG tablet Take 1 tablet (100 mg total) by mouth daily. (Patient not taking: Reported on  03/06/2016) 30 tablet 0   No current facility-administered medications for this visit.     REVIEW OF SYSTEMS:  [X]  denotes positive finding, [ ]  denotes negative finding Cardiac  Comments:  Chest pain or chest pressure:    Shortness of breath upon exertion:    Short of breath when lying flat:    Irregular heart rhythm:        Vascular    Pain in calf, thigh, or hip brought on by ambulation:    Pain in feet at night that wakes you up from your sleep:     Blood clot in your veins:    Leg swelling:         Pulmonary    Oxygen at home:    Productive cough:     Wheezing:         Neurologic    Sudden weakness in arms or legs:     Sudden numbness in arms or legs:     Sudden onset of difficulty speaking or slurred speech:    Temporary loss of vision in one eye:     Problems with dizziness:         Gastrointestinal    Blood in stool:     Vomited blood:         Genitourinary    Burning when urinating:     Blood in urine:  Psychiatric    Major depression:         Hematologic    Bleeding problems:    Problems with blood clotting too easily:        Skin    Rashes or ulcers:        Constitutional    Fever or chills:      PHYSICAL EXAM: Vitals:   03/06/16 0906 03/06/16 0914  BP: 134/70 (!) 147/80  Pulse: 99 86  Resp: 16   Temp: 98.8 F (37.1 C)   TempSrc: Oral   SpO2: 98%   Weight: 175 lb (79.4 kg)   Height: 5\' 8"  (1.727 m)     GENERAL: The patient is a well-nourished male, in no acute distress. The vital signs are documented above. CARDIAC: There is a regular rate and rhythm.  VASCULAR: On arteries without bruits bilaterally. Well-healed left neck incision PULMONARY: There is good air exchange   MUSCULOSKELETAL: There are no major deformities or cyanosis. NEUROLOGIC: No focal weakness or paresthesias are detected. SKIN: There are no ulcers or rashes noted. PSYCHIATRIC: The patient has a normal affect.  DATA:  Carotid duplex was reviewed from  03/01/2016. This shows widely patent endarterectomy with no stenosis. He does have a very high-grade common carotid artery stenosis on the right.  MEDICAL ISSUES: Stable status post left endarterectomy for symptomatic disease. Continues to have high-grade right common carotid artery stenosis which is asymptomatic. This was pleasant present on his prior studies. Explained is more difficult to know the appropriate treatment for high-grade common carotid artery stenosis. Again reviewed symptoms of carotid disease with him and he will notify us me that should this occur and presented to the emergency room for any major events. Otherwise we'll see him in 6 months with the with a CT angiogram for further discussion regarding his asymptomatic right common carotid artery stenosis    Rosetta Posner, MD FACS [facility] Tel [phone] Pager [phone]

## 2016-06-08 NOTE — Progress Notes (Deleted)
*  IMAGE* Office Visit Note  Patient: Connor Butler             Date of Birth: 08/24/1950           MRN: [id number]             PCP: Simona Huh, MD Referring: Gaynelle Arabian, MD Visit Date: 06/13/2016    Subjective:  No chief complaint on file.   History of Present Illness: Connor Butler is a 65 y.o. male ***   Activities of Daily Living:  Patient reports morning stiffness for *** {minute/hour:19697}.   Patient {ACTIONS;DENIES/REPORTS:21021675::"Denies"} nocturnal pain.  Difficulty dressing/grooming: {ACTIONS;DENIES/REPORTS:21021675::"Denies"} Difficulty climbing stairs: {ACTIONS;DENIES/REPORTS:21021675::"Denies"} Difficulty getting out of chair: {ACTIONS;DENIES/REPORTS:21021675::"Denies"} Difficulty using hands for taps, buttons, cutlery, and/or writing: {ACTIONS;DENIES/REPORTS:21021675::"Denies"}   No Rheumatology ROS completed.   PMFS History:  Patient Active Problem List   Diagnosis Date Noted  . Symptomatic stenosis of left carotid artery 06/06/2015  . CVA (cerebral infarction) 06/01/2015  . Acute CVA (cerebrovascular accident) ([place]) 06/01/2015  . Alcohol dependence ([place]) 06/01/2015  . Tobacco abuse disorder 06/01/2015  . Hyponatremia 06/01/2015  . Hypertension 06/01/2015  . Bilateral carotid artery stenosis 06/01/2015  . Cannabis abuse, episodic 06/01/2015  . Stroke with cerebral ischemia ([place])   . Occlusion and stenosis of carotid artery without mention of cerebral infarction 12/01/2013    Past Medical History:  Diagnosis Date  . Arthritis   . Carotid artery occlusion   . Hypertension     Family History  Problem Relation Age of Onset  . Stroke Father    Past Surgical History:  Procedure Laterality Date  . ENDARTERECTOMY Left 06/06/2015   Procedure: LEFT  CAROTID ARTERY ENDARTERECTOMY;  Surgeon: Rosetta Posner, MD;  Location: [facility];  Service: Vascular;  Laterality: Left;  . EYE SURGERY Bilateral    Cataract  . HERNIA REPAIR  2003   abdnormal   .  PATCH ANGIOPLASTY Left 06/06/2015   Procedure: WITH HEMASHIELD PATCH ANGIOPLASTY;  Surgeon: Rosetta Posner, MD;  Location: [facility] OR;  Service: Vascular;  Laterality: Left;   Social History   Social History Narrative  . No narrative on file     Objective: Vital Signs: There were no vitals taken for this visit.   Physical Exam   Musculoskeletal Exam: ***  CDAI Exam: No CDAI exam completed.    Investigation: Findings:  12/19/2015 CMP normal, CBC normal ,sed rate n, hepatitis negative, immunoglobulin normal, IgM 29, SPEP normal. TB negative, HLA-B27 negative    Imaging: No results found.  Speciality Comments: No specialty comments available.    Procedures:  No procedures performed Allergies: Review of patient's allergies indicates no known allergies.   Assessment / Plan: Visit Diagnoses: Osteoarthritis of lumbar spine, unspecified spinal osteoarthritis complication status  History of vertebral fracture  Primary osteoarthritis of both hands  Contracture of elbow joint, right  Avascular necrosis (HCC) - BHJ mild    Orders: No orders of the defined types were placed in this encounter.  No orders of the defined types were placed in this encounter.   Face-to-face time spent with patient was *** minutes. 50% of time was spent in counseling and coordination of care.  Follow-Up Instructions: No Follow-up on file.

## 2016-06-12 ENCOUNTER — Encounter: Payer: Self-pay | Admitting: *Deleted

## 2016-06-12 DIAGNOSIS — M5136 Other intervertebral disc degeneration, lumbar region: Secondary | ICD-10-CM

## 2016-06-12 DIAGNOSIS — M24521 Contracture, right elbow: Secondary | ICD-10-CM

## 2016-06-12 DIAGNOSIS — M51369 Other intervertebral disc degeneration, lumbar region without mention of lumbar back pain or lower extremity pain: Secondary | ICD-10-CM

## 2016-06-12 DIAGNOSIS — E7800 Pure hypercholesterolemia, unspecified: Secondary | ICD-10-CM

## 2016-06-12 DIAGNOSIS — M87051 Idiopathic aseptic necrosis of right femur: Secondary | ICD-10-CM

## 2016-06-12 DIAGNOSIS — I2510 Atherosclerotic heart disease of native coronary artery without angina pectoris: Secondary | ICD-10-CM

## 2016-06-12 DIAGNOSIS — M87052 Idiopathic aseptic necrosis of left femur: Secondary | ICD-10-CM

## 2016-06-12 DIAGNOSIS — M19041 Primary osteoarthritis, right hand: Secondary | ICD-10-CM

## 2016-06-12 DIAGNOSIS — M19042 Primary osteoarthritis, left hand: Secondary | ICD-10-CM

## 2016-06-12 HISTORY — DX: Contracture, right elbow: M24.521

## 2016-06-12 HISTORY — DX: Pure hypercholesterolemia, unspecified: E78.00

## 2016-06-12 HISTORY — DX: Other intervertebral disc degeneration, lumbar region: M51.36

## 2016-06-12 HISTORY — DX: Atherosclerotic heart disease of native coronary artery without angina pectoris: I25.10

## 2016-06-12 HISTORY — DX: Primary osteoarthritis, right hand: M19.041

## 2016-06-12 HISTORY — DX: Idiopathic aseptic necrosis of left femur: M87.051

## 2016-06-12 HISTORY — DX: Other intervertebral disc degeneration, lumbar region without mention of lumbar back pain or lower extremity pain: M51.369

## 2016-06-13 ENCOUNTER — Ambulatory Visit: Payer: Self-pay | Admitting: Rheumatology

## 2016-06-27 ENCOUNTER — Other Ambulatory Visit: Payer: Self-pay | Admitting: *Deleted

## 2016-06-27 DIAGNOSIS — I6529 Occlusion and stenosis of unspecified carotid artery: Secondary | ICD-10-CM

## 2016-09-04 ENCOUNTER — Encounter: Payer: Self-pay | Admitting: Vascular Surgery

## 2016-09-04 ENCOUNTER — Other Ambulatory Visit: Payer: Commercial Managed Care - HMO

## 2016-09-11 ENCOUNTER — Ambulatory Visit: Payer: Commercial Managed Care - HMO | Admitting: Vascular Surgery

## 2016-10-09 ENCOUNTER — Encounter: Payer: Self-pay | Admitting: Vascular Surgery

## 2016-10-11 ENCOUNTER — Telehealth: Payer: Self-pay | Admitting: Vascular Surgery

## 2016-10-11 ENCOUNTER — Inpatient Hospital Stay: Admission: RE | Admit: 2016-10-11 | Payer: Medicare HMO | Source: Ambulatory Visit

## 2016-10-11 NOTE — Telephone Encounter (Signed)
The patient missed his CTA appt for today 10/11/16 at [place].  I called him and he stated he forgot. I rescheduled the appt for the CTA to Wed 10/17/16 at 12:10pm. He knows to arrive at 11:45am to the [address] location of [facility]. Then he will see TFE for follow up on 3/27'/18 at 2:15pm. The patient voiced understanding. awt

## 2016-10-16 ENCOUNTER — Ambulatory Visit: Payer: Medicare HMO | Admitting: Vascular Surgery

## 2016-10-17 ENCOUNTER — Ambulatory Visit
Admission: RE | Admit: 2016-10-17 | Discharge: 2016-10-17 | Disposition: A | Discharge status: Home / Self Care | Payer: Medicare HMO | Source: Ambulatory Visit | Attending: Vascular Surgery | Admitting: Vascular Surgery

## 2016-10-17 DIAGNOSIS — I6522 Occlusion and stenosis of left carotid artery: Secondary | ICD-10-CM | POA: Diagnosis not present

## 2016-10-17 DIAGNOSIS — I6529 Occlusion and stenosis of unspecified carotid artery: Secondary | ICD-10-CM

## 2016-10-17 MED ORDER — IOPAMIDOL (ISOVUE-370) INJECTION 76%
75.0000 mL | Freq: Once | INTRAVENOUS | Status: AC | PRN
  Administered 2016-10-17: 75 mL via INTRAVENOUS

## 2016-10-24 ENCOUNTER — Encounter: Payer: Self-pay | Admitting: Vascular Surgery

## 2016-11-06 ENCOUNTER — Ambulatory Visit (INDEPENDENT_AMBULATORY_CARE_PROVIDER_SITE_OTHER): Payer: Medicare HMO | Admitting: Vascular Surgery

## 2016-11-06 ENCOUNTER — Encounter: Payer: Self-pay | Admitting: Vascular Surgery

## 2016-11-06 VITALS — BP 145/76 | HR 94 | Temp 97.6°F | Resp 16 | Ht 65.5 in | Wt 175.0 lb

## 2016-11-06 DIAGNOSIS — I6523 Occlusion and stenosis of bilateral carotid arteries: Secondary | ICD-10-CM | POA: Diagnosis not present

## 2016-11-06 DIAGNOSIS — R6889 Other general symptoms and signs: Secondary | ICD-10-CM | POA: Diagnosis not present
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
## 2016-11-07 NOTE — Addendum Note (Signed)
Addended by: Lianne Cure A on: 11/07/2016 03:43 PM   Modules accepted: Orders

## 2016-11-27 ENCOUNTER — Encounter: Payer: Self-pay | Admitting: Cardiology

## 2017-02-12 DIAGNOSIS — Z1389 Encounter for screening for other disorder: Secondary | ICD-10-CM | POA: Diagnosis not present

## 2017-02-12 DIAGNOSIS — I779 Disorder of arteries and arterioles, unspecified: Secondary | ICD-10-CM | POA: Diagnosis not present

## 2017-02-12 DIAGNOSIS — I10 Essential (primary) hypertension: Secondary | ICD-10-CM | POA: Diagnosis not present

## 2017-02-12 DIAGNOSIS — Z1159 Encounter for screening for other viral diseases: Secondary | ICD-10-CM | POA: Diagnosis not present

## 2017-02-12 DIAGNOSIS — R6889 Other general symptoms and signs: Secondary | ICD-10-CM | POA: Diagnosis not present

## 2017-02-12 DIAGNOSIS — Z8673 Personal history of transient ischemic attack (TIA), and cerebral infarction without residual deficits: Secondary | ICD-10-CM | POA: Diagnosis not present

## 2017-02-12 DIAGNOSIS — F1010 Alcohol abuse, uncomplicated: Secondary | ICD-10-CM | POA: Diagnosis not present

## 2017-02-12 DIAGNOSIS — I6503 Occlusion and stenosis of bilateral vertebral arteries: Secondary | ICD-10-CM | POA: Diagnosis not present

## 2017-02-12 DIAGNOSIS — G479 Sleep disorder, unspecified: Secondary | ICD-10-CM | POA: Diagnosis not present

## 2017-02-12 DIAGNOSIS — Z0000 Encounter for general adult medical examination without abnormal findings: Secondary | ICD-10-CM | POA: Diagnosis not present

## 2017-02-12 DIAGNOSIS — E7800 Pure hypercholesterolemia, unspecified: Secondary | ICD-10-CM | POA: Diagnosis not present

## 2017-02-12 DIAGNOSIS — I6930 Unspecified sequelae of cerebral infarction: Secondary | ICD-10-CM | POA: Diagnosis not present

## 2017-03-04 ENCOUNTER — Ambulatory Visit
Admission: RE | Admit: 2017-03-04 | Discharge: 2017-03-04 | Disposition: A | Discharge status: Home / Self Care | Payer: Medicare HMO | Source: Ambulatory Visit | Attending: Family Medicine | Admitting: Family Medicine

## 2017-03-04 ENCOUNTER — Other Ambulatory Visit: Payer: Self-pay | Admitting: Family Medicine

## 2017-03-04 DIAGNOSIS — E871 Hypo-osmolality and hyponatremia: Secondary | ICD-10-CM

## 2017-03-04 DIAGNOSIS — R0600 Dyspnea, unspecified: Secondary | ICD-10-CM

## 2017-03-04 DIAGNOSIS — R0602 Shortness of breath: Secondary | ICD-10-CM | POA: Diagnosis not present

## 2017-05-22 ENCOUNTER — Encounter (HOSPITAL_COMMUNITY): Payer: Self-pay | Admitting: Emergency Medicine

## 2017-05-22 ENCOUNTER — Ambulatory Visit (HOSPITAL_COMMUNITY)
Admission: EM | Admit: 2017-05-22 | Discharge: 2017-05-22 | Disposition: A | Discharge status: Home / Self Care | Payer: Medicare HMO | Attending: Family Medicine | Admitting: Family Medicine

## 2017-05-22 DIAGNOSIS — L309 Dermatitis, unspecified: Secondary | ICD-10-CM | POA: Diagnosis not present

## 2017-05-22 MED ORDER — BETAMETHASONE DIPROPIONATE 0.05 % EX OINT: TOPICAL_OINTMENT | Freq: Two times a day (BID) | CUTANEOUS | 0 refills | Status: AC

## 2017-05-22 NOTE — ED Triage Notes (Signed)
PT reports rash around finger nails for over a week. PT skin is red and flaky. PT has tried several topical meds. PT reports area is painful and itchy.
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
## 2017-05-27 DIAGNOSIS — L309 Dermatitis, unspecified: Secondary | ICD-10-CM | POA: Diagnosis not present

## 2017-11-12 ENCOUNTER — Encounter (HOSPITAL_COMMUNITY): Payer: Medicare HMO

## 2017-11-12 ENCOUNTER — Ambulatory Visit: Payer: Medicare HMO | Admitting: Family

## 2018-03-11 IMAGING — DX DG CHEST 2V
2 series · 2 of 2 positions shown · non-contrast
Comparison: 06/01/2015

CLINICAL DATA: Hyponatremia.  Shortness of breath.

EXAM:
CHEST  2 VIEW

[dg chest 2 view (1 of 2)]
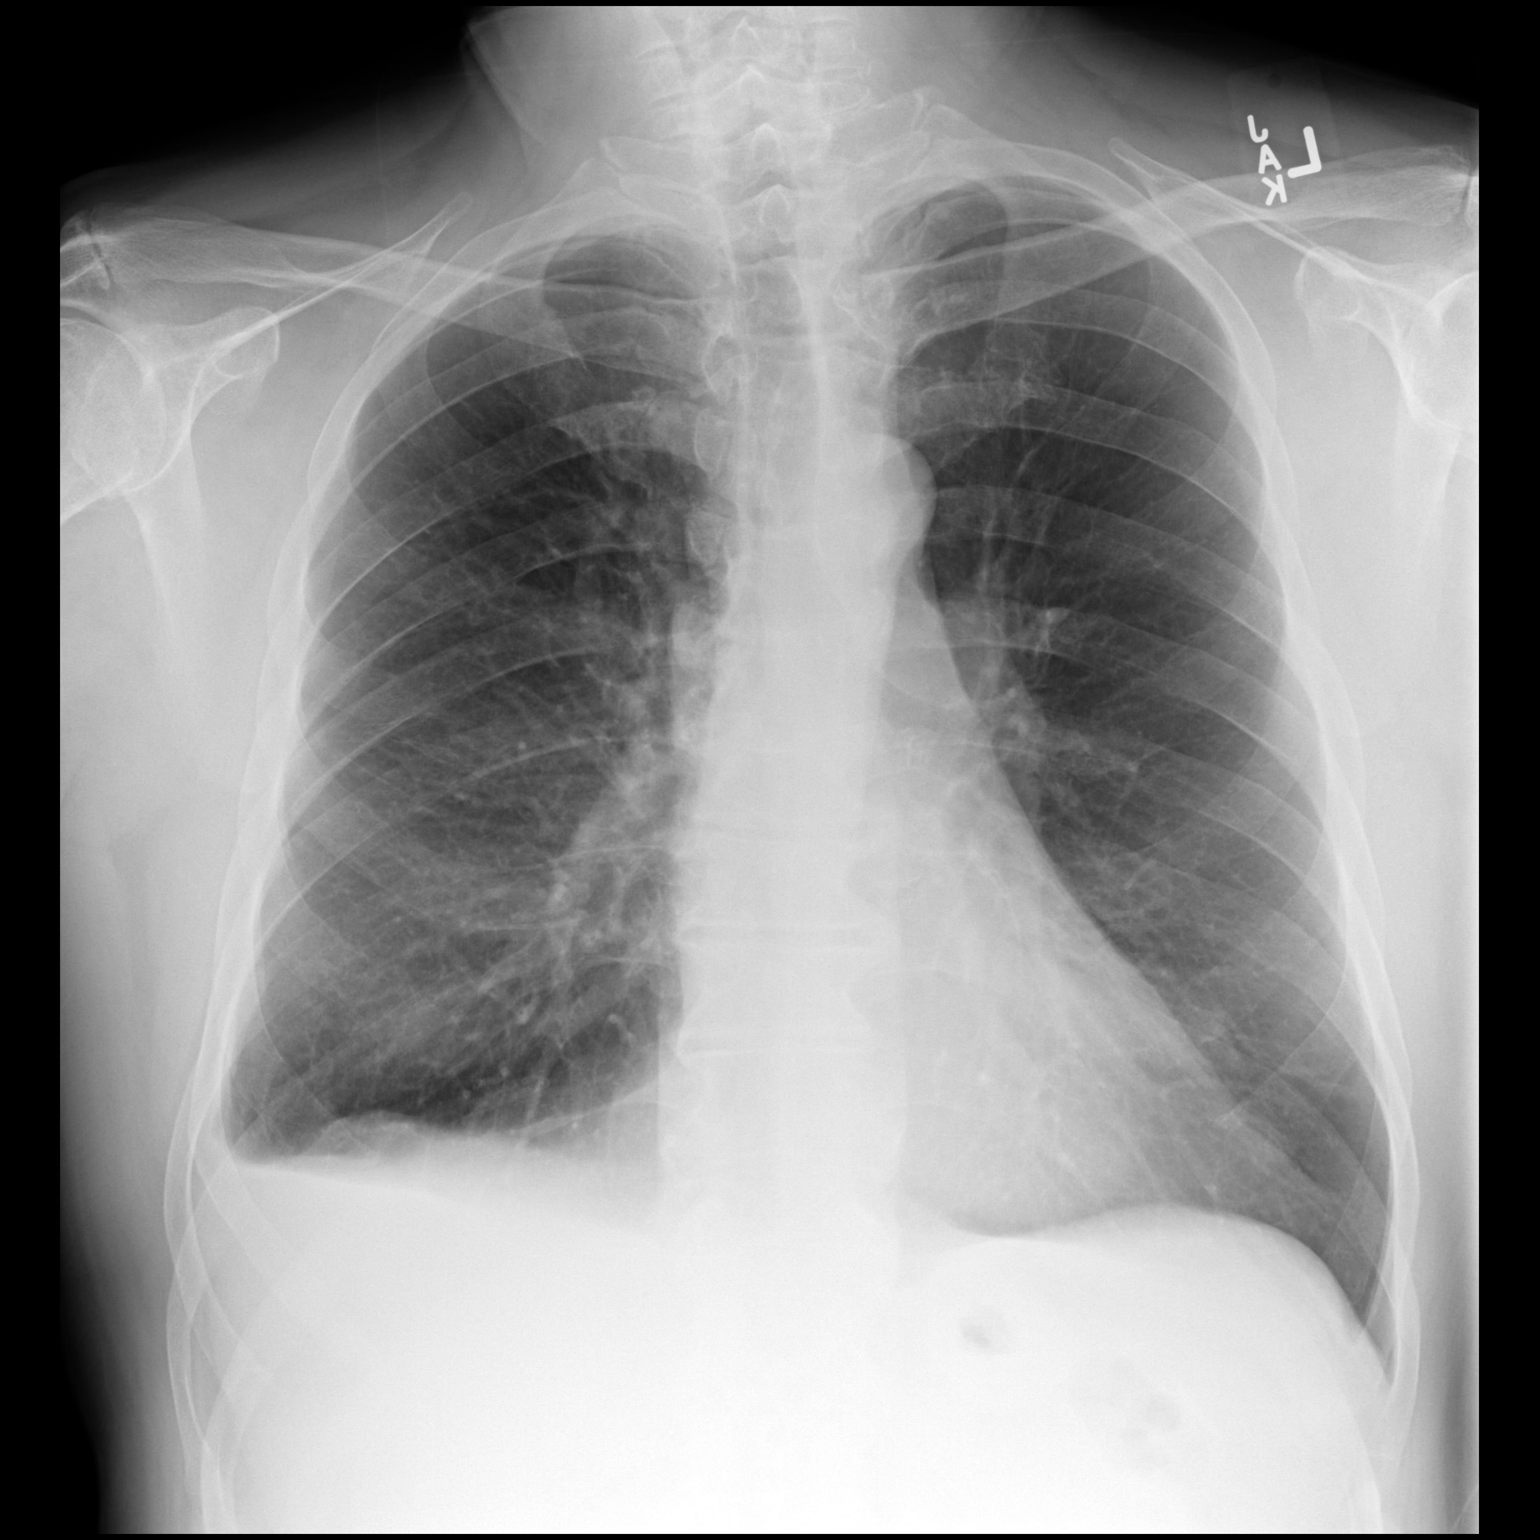

[dg chest 2 view (2 of 2)]
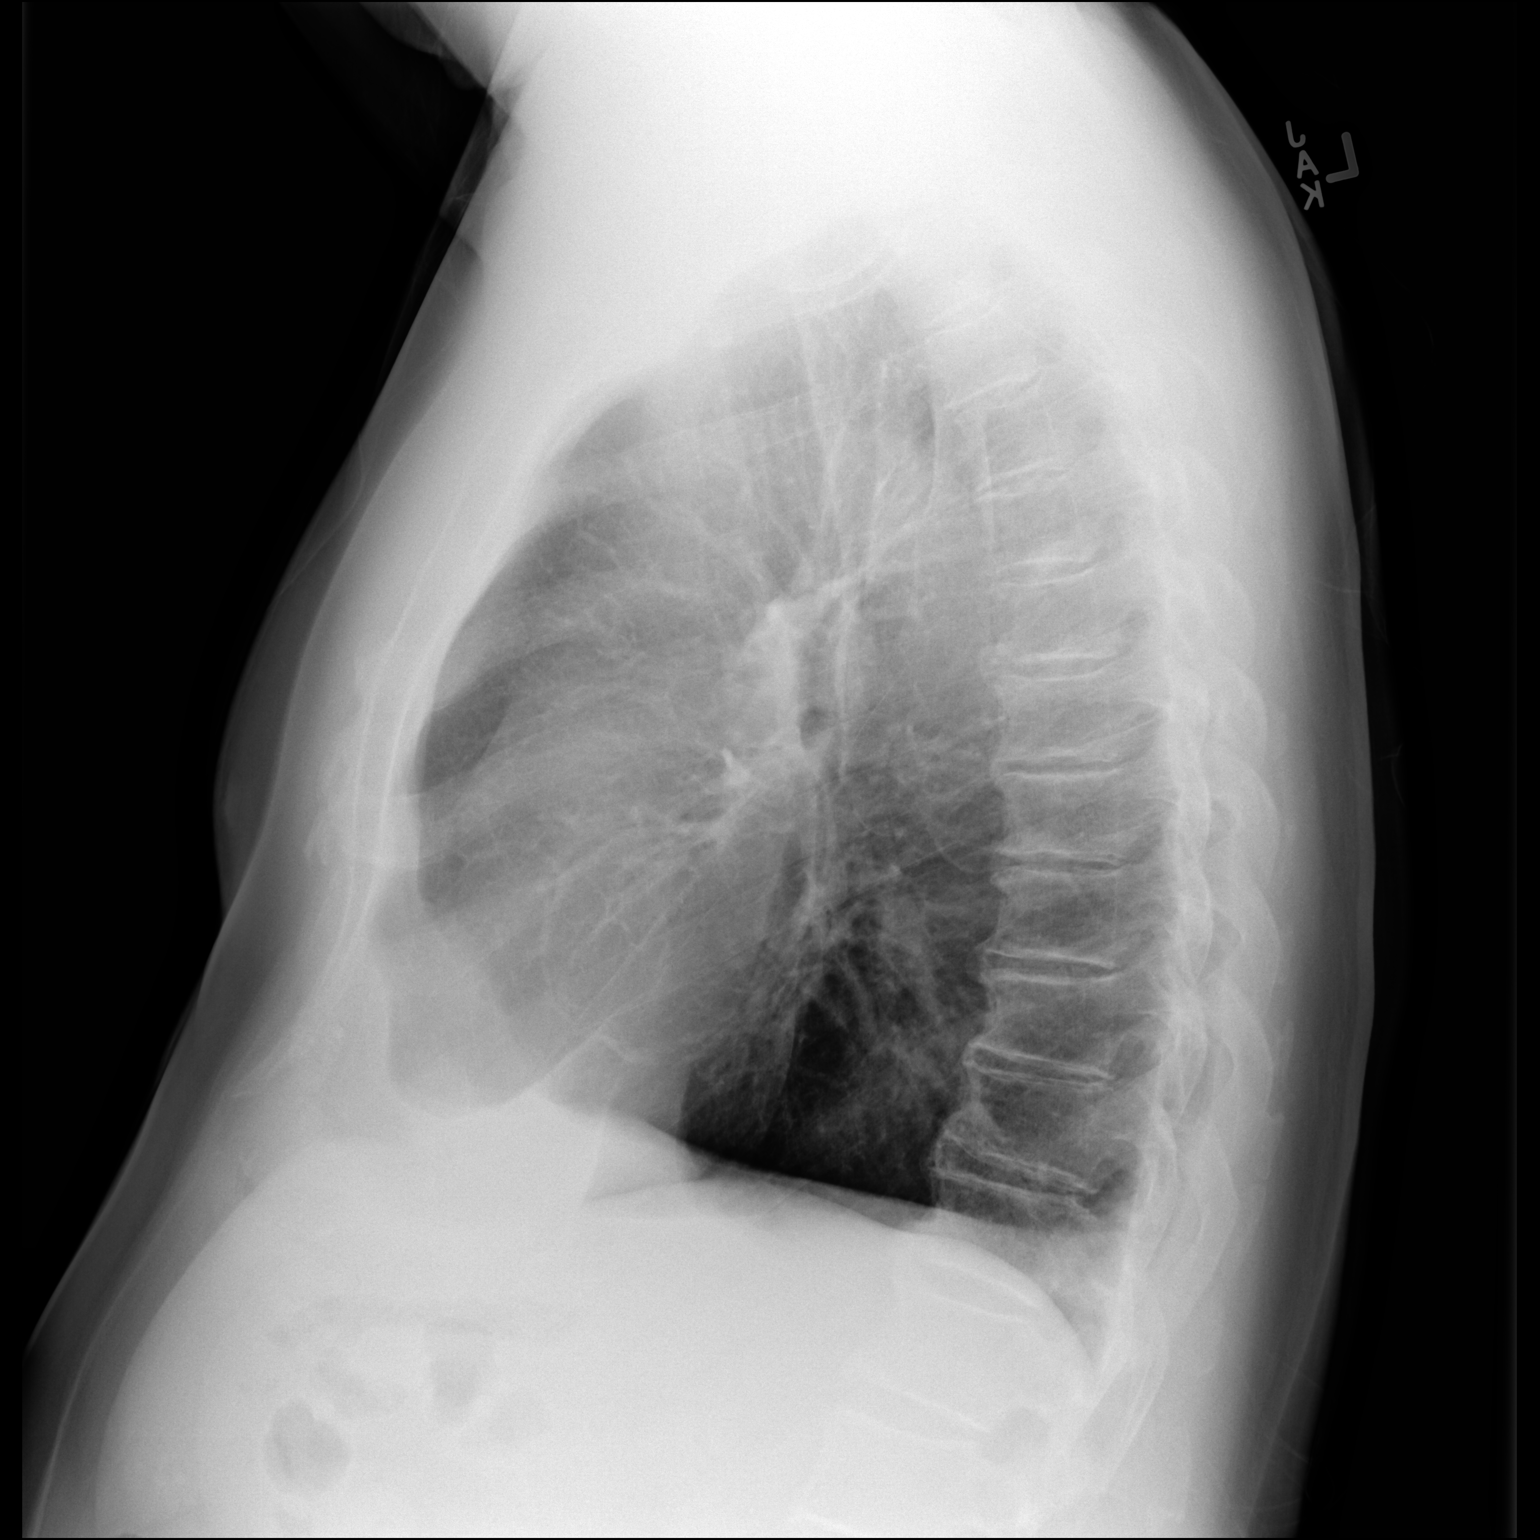

[2 of 2 positions shown; findings below may reference images not displayed]

FINDINGS: Normal heart size. No pleural effusion or edema. Chronic
pleural-parenchymal scarring at the right lung base with blunting of
the right hemidiaphragm. No airspace opacities identified.
IMPRESSION: 1. No acute cardiopulmonary abnormalities.

## 2018-04-02 DIAGNOSIS — Z125 Encounter for screening for malignant neoplasm of prostate: Secondary | ICD-10-CM | POA: Diagnosis not present

## 2018-04-02 DIAGNOSIS — E7800 Pure hypercholesterolemia, unspecified: Secondary | ICD-10-CM | POA: Diagnosis not present

## 2018-04-02 DIAGNOSIS — I6930 Unspecified sequelae of cerebral infarction: Secondary | ICD-10-CM | POA: Diagnosis not present

## 2018-04-02 DIAGNOSIS — Z8673 Personal history of transient ischemic attack (TIA), and cerebral infarction without residual deficits: Secondary | ICD-10-CM | POA: Diagnosis not present

## 2018-04-02 DIAGNOSIS — I779 Disorder of arteries and arterioles, unspecified: Secondary | ICD-10-CM | POA: Diagnosis not present

## 2018-04-02 DIAGNOSIS — I10 Essential (primary) hypertension: Secondary | ICD-10-CM | POA: Diagnosis not present

## 2018-04-02 DIAGNOSIS — D692 Other nonthrombocytopenic purpura: Secondary | ICD-10-CM | POA: Diagnosis not present

## 2018-04-02 DIAGNOSIS — Z1389 Encounter for screening for other disorder: Secondary | ICD-10-CM | POA: Diagnosis not present

## 2018-04-02 DIAGNOSIS — Z0000 Encounter for general adult medical examination without abnormal findings: Secondary | ICD-10-CM | POA: Diagnosis not present

## 2018-04-02 DIAGNOSIS — I6503 Occlusion and stenosis of bilateral vertebral arteries: Secondary | ICD-10-CM | POA: Diagnosis not present

## 2018-04-11 DIAGNOSIS — Z1211 Encounter for screening for malignant neoplasm of colon: Secondary | ICD-10-CM | POA: Diagnosis not present

## 2018-04-11 DIAGNOSIS — K5730 Diverticulosis of large intestine without perforation or abscess without bleeding: Secondary | ICD-10-CM | POA: Diagnosis not present

## 2018-04-11 DIAGNOSIS — K640 First degree hemorrhoids: Secondary | ICD-10-CM | POA: Diagnosis not present

## 2018-04-11 DIAGNOSIS — D126 Benign neoplasm of colon, unspecified: Secondary | ICD-10-CM | POA: Diagnosis not present

## 2018-04-16 DIAGNOSIS — Z1211 Encounter for screening for malignant neoplasm of colon: Secondary | ICD-10-CM | POA: Diagnosis not present

## 2018-04-16 DIAGNOSIS — D126 Benign neoplasm of colon, unspecified: Secondary | ICD-10-CM | POA: Diagnosis not present

## 2018-04-29 DIAGNOSIS — R946 Abnormal results of thyroid function studies: Secondary | ICD-10-CM | POA: Diagnosis not present

## 2018-04-29 DIAGNOSIS — E871 Hypo-osmolality and hyponatremia: Secondary | ICD-10-CM | POA: Diagnosis not present

## 2018-05-06 ENCOUNTER — Other Ambulatory Visit: Payer: Self-pay | Admitting: Family Medicine

## 2018-05-06 ENCOUNTER — Ambulatory Visit
Admission: RE | Admit: 2018-05-06 | Discharge: 2018-05-06 | Disposition: A | Discharge status: Home / Self Care | Payer: Medicare HMO | Source: Ambulatory Visit | Attending: Family Medicine | Admitting: Family Medicine

## 2018-05-06 DIAGNOSIS — E871 Hypo-osmolality and hyponatremia: Secondary | ICD-10-CM | POA: Diagnosis not present

## 2019-04-06 DIAGNOSIS — Z0000 Encounter for general adult medical examination without abnormal findings: Secondary | ICD-10-CM | POA: Diagnosis not present

## 2019-04-06 DIAGNOSIS — E7800 Pure hypercholesterolemia, unspecified: Secondary | ICD-10-CM | POA: Diagnosis not present

## 2019-04-06 DIAGNOSIS — Z8673 Personal history of transient ischemic attack (TIA), and cerebral infarction without residual deficits: Secondary | ICD-10-CM | POA: Diagnosis not present

## 2019-04-06 DIAGNOSIS — I10 Essential (primary) hypertension: Secondary | ICD-10-CM | POA: Diagnosis not present

## 2019-04-06 DIAGNOSIS — I779 Disorder of arteries and arterioles, unspecified: Secondary | ICD-10-CM | POA: Diagnosis not present

## 2019-04-06 DIAGNOSIS — Z1389 Encounter for screening for other disorder: Secondary | ICD-10-CM | POA: Diagnosis not present

## 2019-04-06 DIAGNOSIS — I6503 Occlusion and stenosis of bilateral vertebral arteries: Secondary | ICD-10-CM | POA: Diagnosis not present

## 2019-04-06 DIAGNOSIS — I6930 Unspecified sequelae of cerebral infarction: Secondary | ICD-10-CM | POA: Diagnosis not present

## 2019-04-09 DIAGNOSIS — E7800 Pure hypercholesterolemia, unspecified: Secondary | ICD-10-CM | POA: Diagnosis not present

## 2019-04-09 DIAGNOSIS — I10 Essential (primary) hypertension: Secondary | ICD-10-CM | POA: Diagnosis not present

## 2019-05-07 DIAGNOSIS — I10 Essential (primary) hypertension: Secondary | ICD-10-CM | POA: Diagnosis not present

## 2019-05-07 DIAGNOSIS — M1990 Unspecified osteoarthritis, unspecified site: Secondary | ICD-10-CM | POA: Diagnosis not present

## 2019-05-13 IMAGING — DX DG CHEST 2V
2 series · 2 of 2 positions shown · non-contrast
Comparison: 03/04/2017

CLINICAL DATA: Hyponatremia, hypertension, former smoker

EXAM:
CHEST - 2 VIEW

[dg chest 2 view (1 of 2)]
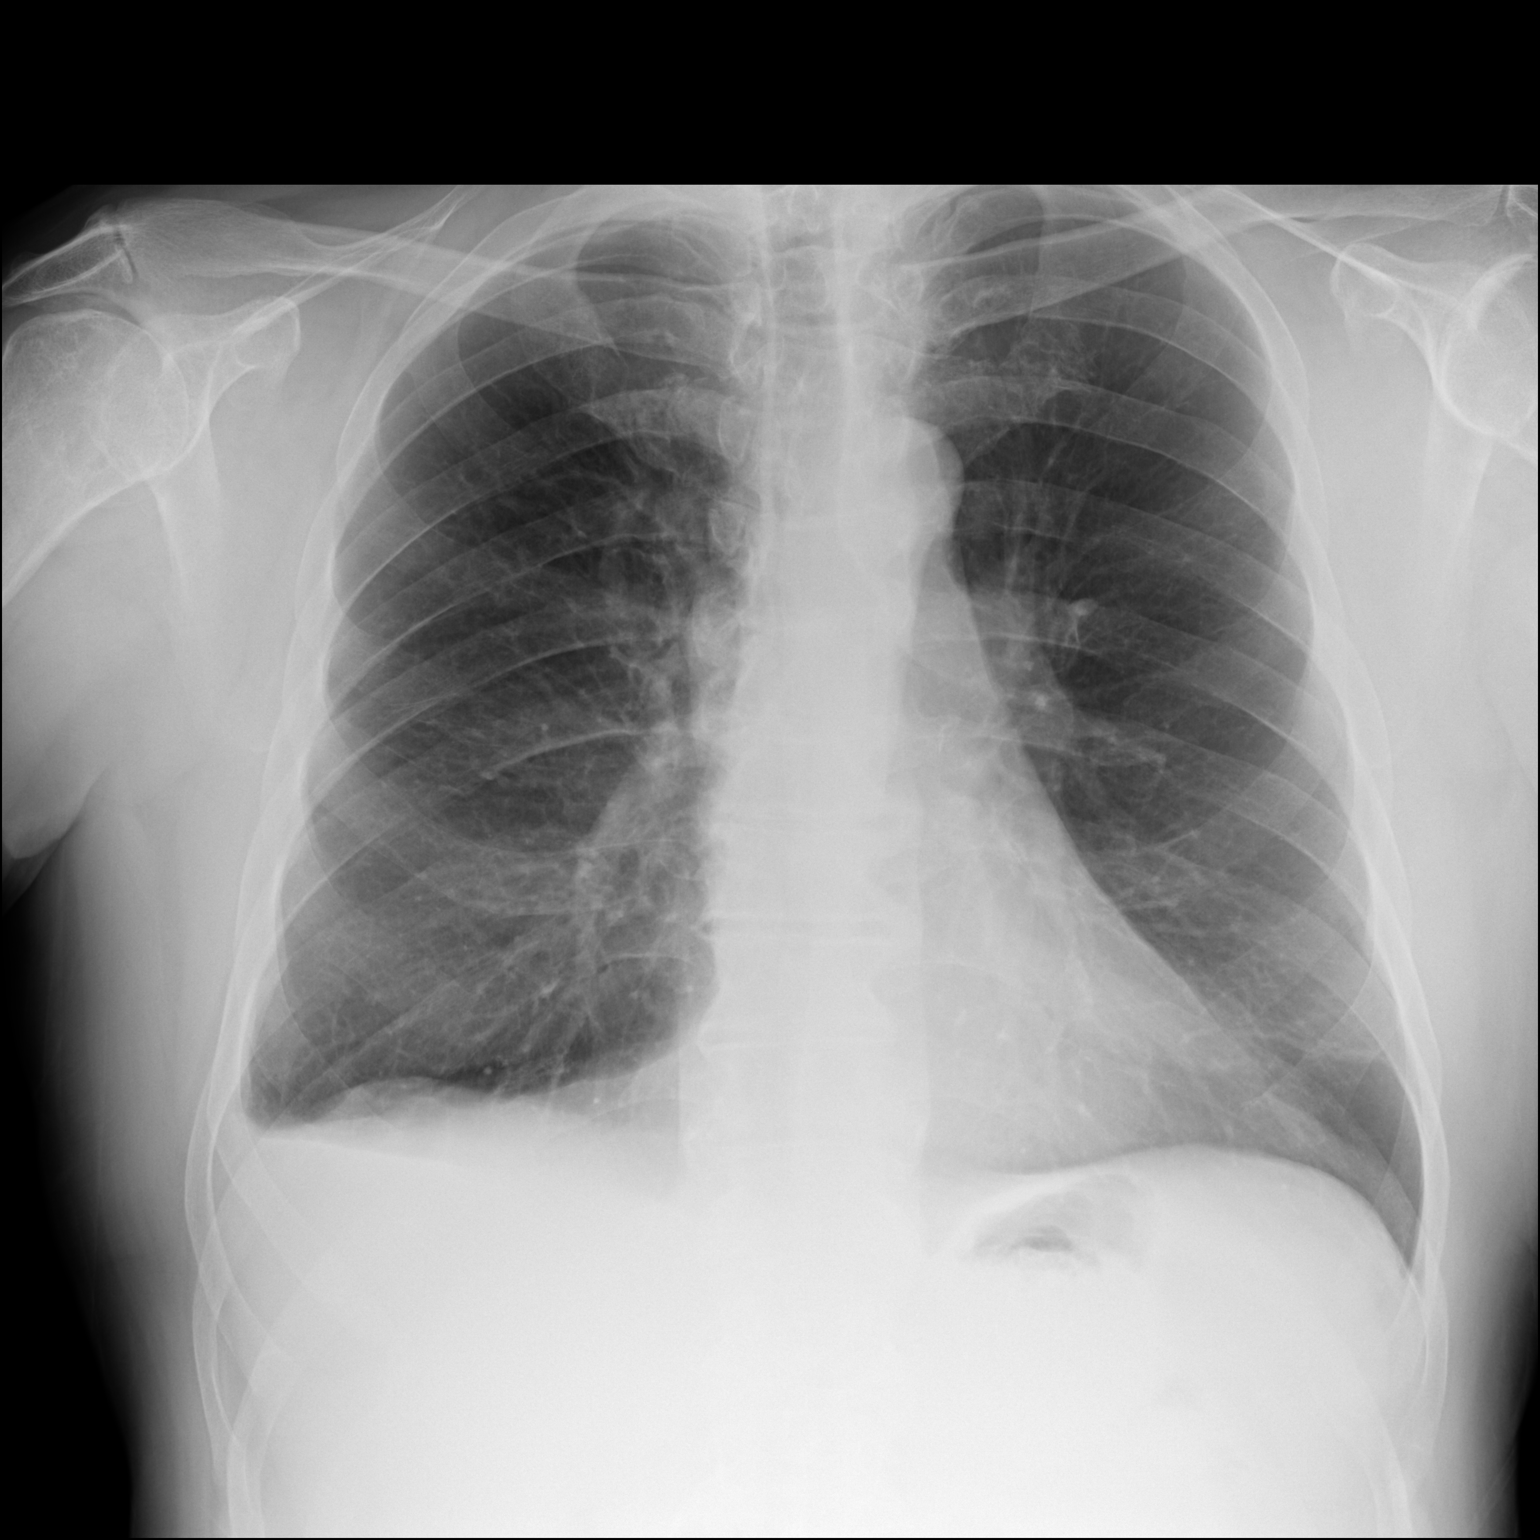

[dg chest 2 view (2 of 2)]
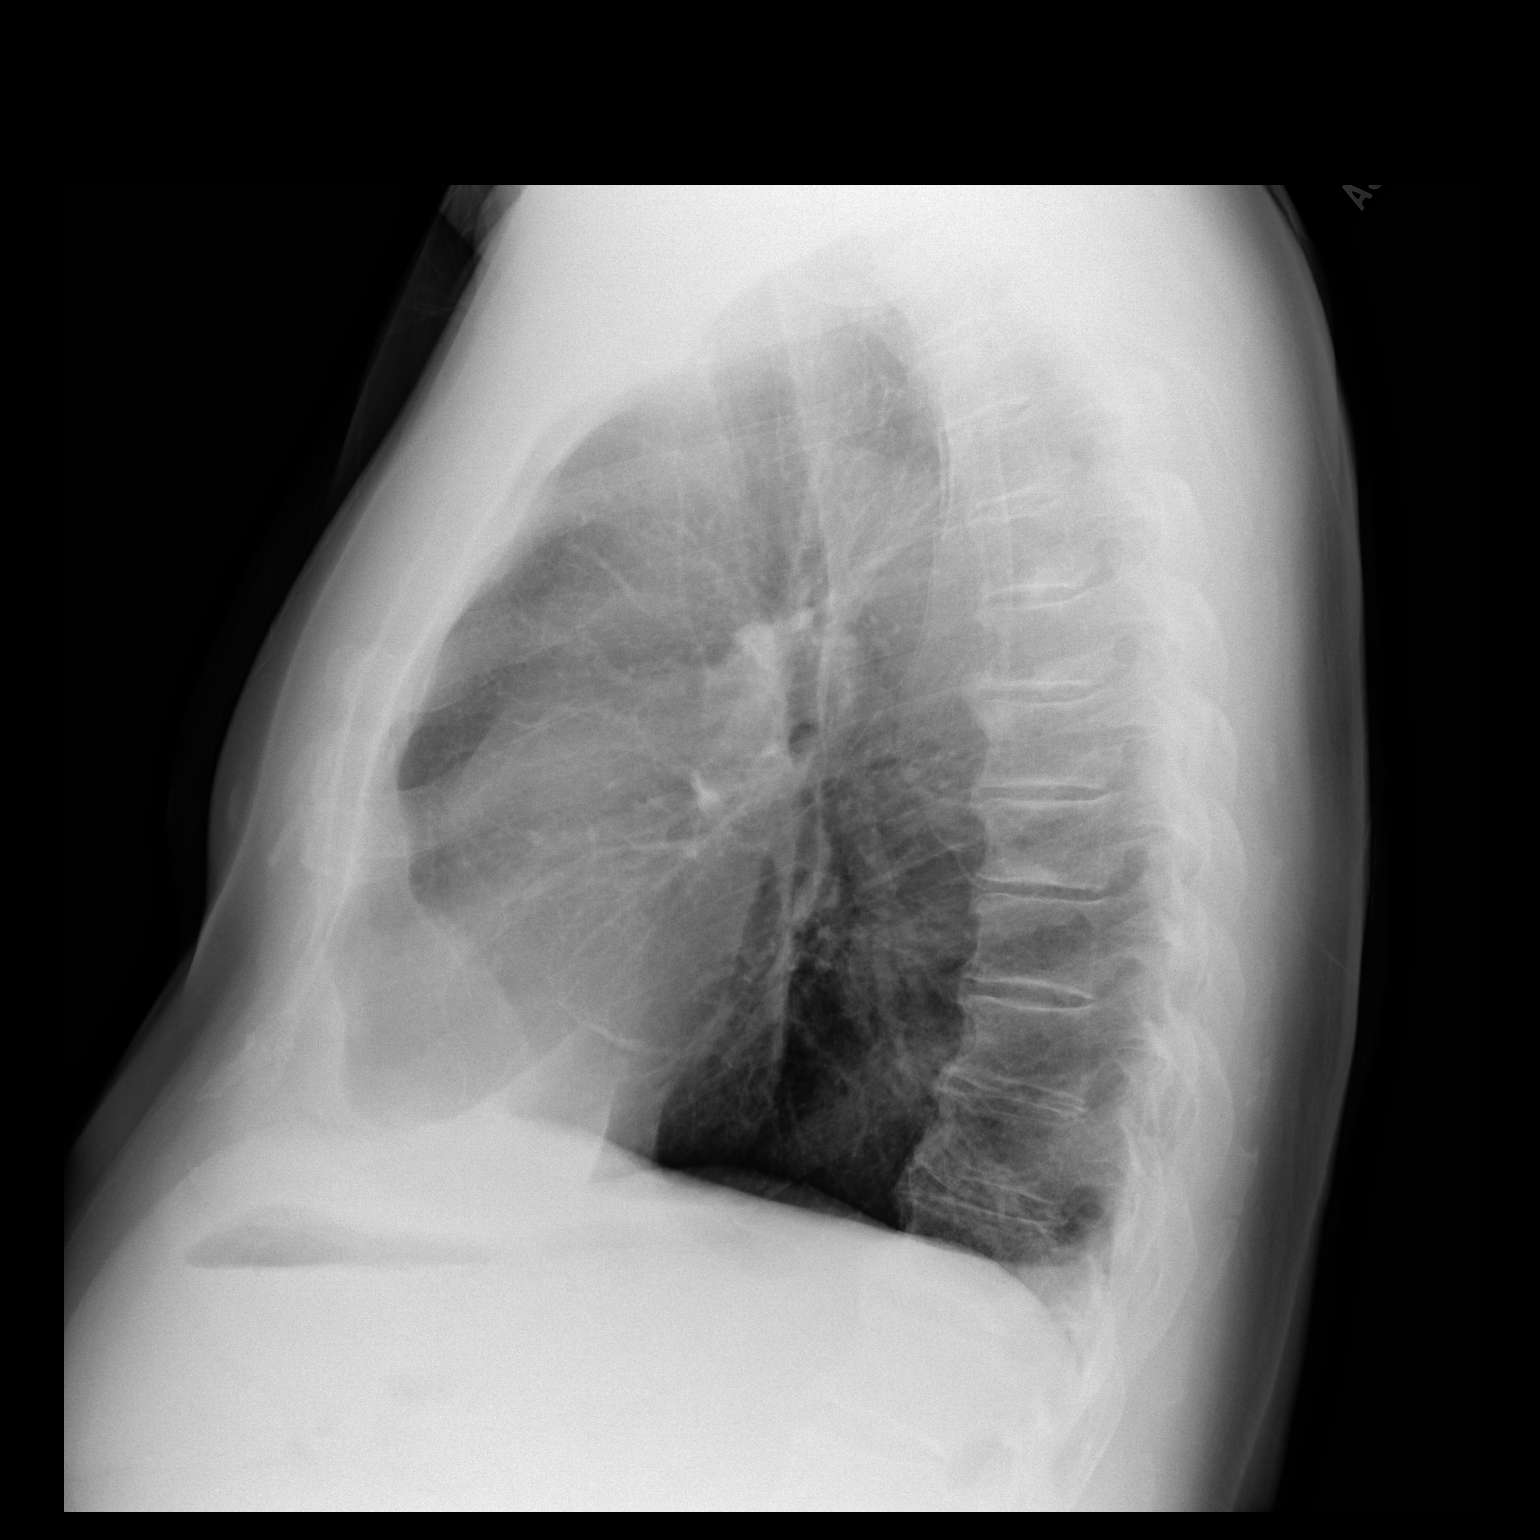

[2 of 2 positions shown; findings below may reference images not displayed]

FINDINGS: Normal heart size, mediastinal contours, and pulmonary vascularity.

Chronic blunting of the RIGHT lateral costophrenic angle by either
pleural thickening or small effusion unchanged.

Linear subsegmental atelectasis versus scarring at LEFT base
unchanged.

No acute infiltrate or pneumothorax.

Bones unremarkable.
IMPRESSION: Minimal chronic basilar changes bilaterally greater on RIGHT.

No acute abnormalities.

## 2019-11-19 ENCOUNTER — Ambulatory Visit
Admission: RE | Admit: 2019-11-19 | Discharge: 2019-11-19 | Disposition: A | Discharge status: Home / Self Care | Payer: Medicare Other | Source: Ambulatory Visit | Attending: Family Medicine | Admitting: Family Medicine

## 2019-11-19 ENCOUNTER — Other Ambulatory Visit: Payer: Self-pay | Admitting: Family Medicine

## 2019-11-19 DIAGNOSIS — R0600 Dyspnea, unspecified: Secondary | ICD-10-CM

## 2019-11-19 DIAGNOSIS — R0609 Other forms of dyspnea: Secondary | ICD-10-CM

## 2019-12-02 ENCOUNTER — Other Ambulatory Visit (HOSPITAL_COMMUNITY): Payer: Self-pay | Admitting: Respiratory Therapy

## 2019-12-02 DIAGNOSIS — R0600 Dyspnea, unspecified: Secondary | ICD-10-CM

## 2019-12-02 DIAGNOSIS — R0609 Other forms of dyspnea: Secondary | ICD-10-CM

## 2019-12-15 ENCOUNTER — Other Ambulatory Visit (HOSPITAL_COMMUNITY)
Admission: RE | Admit: 2019-12-15 | Discharge: 2019-12-15 | Disposition: A | Discharge status: Home / Self Care | Payer: Medicare Other | Source: Ambulatory Visit | Attending: Family Medicine | Admitting: Family Medicine

## 2019-12-15 DIAGNOSIS — Z01812 Encounter for preprocedural laboratory examination: Secondary | ICD-10-CM | POA: Diagnosis present

## 2019-12-15 DIAGNOSIS — Z20822 Contact with and (suspected) exposure to covid-19: Secondary | ICD-10-CM | POA: Diagnosis not present

## 2019-12-15 LAB — SARS CORONAVIRUS 2 (TAT 6-24 HRS): SARS Coronavirus 2: NEGATIVE

## 2019-12-17 ENCOUNTER — Ambulatory Visit (HOSPITAL_COMMUNITY)
Admission: RE | Admit: 2019-12-17 | Discharge: 2019-12-17 | Disposition: A | Discharge status: Home / Self Care | Payer: Medicare Other | Source: Ambulatory Visit | Attending: Family Medicine | Admitting: Family Medicine

## 2019-12-17 ENCOUNTER — Other Ambulatory Visit: Payer: Self-pay

## 2019-12-17 DIAGNOSIS — R0600 Dyspnea, unspecified: Secondary | ICD-10-CM | POA: Insufficient documentation

## 2019-12-17 LAB — PULMONARY FUNCTION TEST
DL/VA % pred: 75 %
DL/VA: 3.09 ml/min/mmHg/L
DLCO unc % pred: 54 %
DLCO unc: 13.42 ml/min/mmHg
FEF 25-75 Post: 0.47 L/sec
FEF 25-75 Pre: 0.29 L/sec
FEF2575-%Change-Post: 60 %
FEF2575-%Pred-Post: 20 %
FEF2575-%Pred-Pre: 12 %
FEV1-%Change-Post: 26 %
FEV1-%Pred-Post: 31 %
FEV1-%Pred-Pre: 25 %
FEV1-Post: 0.97 L
FEV1-Pre: 0.76 L
FEV1FVC-%Change-Post: 0 %
FEV1FVC-%Pred-Pre: 49 %
FEV6-%Change-Post: 20 %
FEV6-%Pred-Post: 55 %
FEV6-%Pred-Pre: 46 %
FEV6-Post: 2.17 L
FEV6-Pre: 1.8 L
FEV6FVC-%Change-Post: -5 %
FEV6FVC-%Pred-Post: 86 %
FEV6FVC-%Pred-Pre: 91 %
FVC-%Change-Post: 27 %
FVC-%Pred-Post: 64 %
FVC-%Pred-Pre: 50 %
FVC-Post: 2.67 L
FVC-Pre: 2.09 L
Post FEV1/FVC ratio: 36 %
Post FEV6/FVC ratio: 81 %
Pre FEV1/FVC ratio: 37 %
Pre FEV6/FVC Ratio: 86 %

## 2019-12-17 MED ORDER — ALBUTEROL SULFATE (2.5 MG/3ML) 0.083% IN NEBU
2.5000 mg | INHALATION_SOLUTION | Freq: Once | RESPIRATORY_TRACT | Status: AC
  Administered 2019-12-17: 13:00:00 2.5 mg via RESPIRATORY_TRACT

## 2020-02-16 ENCOUNTER — Institutional Professional Consult (permissible substitution): Payer: Medicare Other | Admitting: Pulmonary Disease

## 2020-03-31 ENCOUNTER — Institutional Professional Consult (permissible substitution): Payer: Medicare Other | Admitting: Pulmonary Disease

## 2020-05-11 ENCOUNTER — Ambulatory Visit (INDEPENDENT_AMBULATORY_CARE_PROVIDER_SITE_OTHER): Payer: Medicare Other | Admitting: Pulmonary Disease

## 2020-05-11 ENCOUNTER — Other Ambulatory Visit: Payer: Self-pay

## 2020-05-11 ENCOUNTER — Encounter: Payer: Self-pay | Admitting: Pulmonary Disease

## 2020-05-11 VITALS — BP 134/74 | HR 118 | Temp 99.0°F | Ht 66.5 in | Wt 179.8 lb

## 2020-05-11 DIAGNOSIS — J439 Emphysema, unspecified: Secondary | ICD-10-CM | POA: Diagnosis not present

## 2020-05-11 MED ORDER — BREZTRI AEROSPHERE 160-9-4.8 MCG/ACT IN AERO: 2.0000 | INHALATION_SPRAY | Freq: Two times a day (BID) | RESPIRATORY_TRACT | 0 refills | Status: DC

## 2020-05-11 MED ORDER — BREZTRI AEROSPHERE 160-9-4.8 MCG/ACT IN AERO: 2.0000 | INHALATION_SPRAY | Freq: Two times a day (BID) | RESPIRATORY_TRACT | 11 refills | Status: DC

## 2020-05-11 NOTE — Patient Instructions (Addendum)
Stop symbicort.  Use Breztri 2 puffs twice a day for emphysema/COPD and the shortness of breath.   Come back in 3 months with Dr. Silas Flood.
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
## 2020-08-01 ENCOUNTER — Ambulatory Visit (INDEPENDENT_AMBULATORY_CARE_PROVIDER_SITE_OTHER): Payer: Medicare Other | Admitting: Pulmonary Disease

## 2020-08-01 ENCOUNTER — Encounter: Payer: Self-pay | Admitting: Pulmonary Disease

## 2020-08-01 ENCOUNTER — Other Ambulatory Visit: Payer: Self-pay

## 2020-08-01 VITALS — BP 138/76 | HR 108 | Temp 97.1°F | Ht 66.5 in | Wt 183.0 lb

## 2020-08-01 DIAGNOSIS — Z23 Encounter for immunization: Secondary | ICD-10-CM | POA: Diagnosis not present

## 2020-08-01 DIAGNOSIS — J439 Emphysema, unspecified: Secondary | ICD-10-CM

## 2020-08-01 MED ORDER — BREZTRI AEROSPHERE 160-9-4.8 MCG/ACT IN AERO: 2.0000 | INHALATION_SPRAY | Freq: Two times a day (BID) | RESPIRATORY_TRACT | 2 refills | Status: DC

## 2020-08-01 NOTE — Patient Instructions (Signed)
Nice to see you!  We sent the Breztri to [facility] so they should mail to you now  I am glad it is helping  Flu shot today  Come back to see Dr. Silas Flood in 6 months.
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
## 2020-08-22 DIAGNOSIS — L308 Other specified dermatitis: Secondary | ICD-10-CM | POA: Diagnosis not present

## 2020-08-22 DIAGNOSIS — L508 Other urticaria: Secondary | ICD-10-CM | POA: Diagnosis not present

## 2020-10-24 DIAGNOSIS — E7800 Pure hypercholesterolemia, unspecified: Secondary | ICD-10-CM | POA: Diagnosis not present

## 2020-10-24 DIAGNOSIS — J449 Chronic obstructive pulmonary disease, unspecified: Secondary | ICD-10-CM | POA: Diagnosis not present

## 2020-10-24 DIAGNOSIS — I6503 Occlusion and stenosis of bilateral vertebral arteries: Secondary | ICD-10-CM | POA: Diagnosis not present

## 2020-10-24 DIAGNOSIS — D692 Other nonthrombocytopenic purpura: Secondary | ICD-10-CM | POA: Diagnosis not present

## 2020-10-24 DIAGNOSIS — I779 Disorder of arteries and arterioles, unspecified: Secondary | ICD-10-CM | POA: Diagnosis not present

## 2020-10-24 DIAGNOSIS — I10 Essential (primary) hypertension: Secondary | ICD-10-CM | POA: Diagnosis not present

## 2020-10-24 DIAGNOSIS — Z8673 Personal history of transient ischemic attack (TIA), and cerebral infarction without residual deficits: Secondary | ICD-10-CM | POA: Diagnosis not present

## 2020-10-24 DIAGNOSIS — F17201 Nicotine dependence, unspecified, in remission: Secondary | ICD-10-CM | POA: Diagnosis not present

## 2020-10-24 DIAGNOSIS — I6930 Unspecified sequelae of cerebral infarction: Secondary | ICD-10-CM | POA: Diagnosis not present

## 2021-03-13 ENCOUNTER — Other Ambulatory Visit: Payer: Self-pay | Admitting: Pulmonary Disease

## 2021-03-13 DIAGNOSIS — J439 Emphysema, unspecified: Secondary | ICD-10-CM

## 2021-04-28 DIAGNOSIS — Z8673 Personal history of transient ischemic attack (TIA), and cerebral infarction without residual deficits: Secondary | ICD-10-CM | POA: Diagnosis not present

## 2021-04-28 DIAGNOSIS — F17201 Nicotine dependence, unspecified, in remission: Secondary | ICD-10-CM | POA: Diagnosis not present

## 2021-04-28 DIAGNOSIS — Z0000 Encounter for general adult medical examination without abnormal findings: Secondary | ICD-10-CM | POA: Diagnosis not present

## 2021-04-28 DIAGNOSIS — I10 Essential (primary) hypertension: Secondary | ICD-10-CM | POA: Diagnosis not present

## 2021-04-28 DIAGNOSIS — J449 Chronic obstructive pulmonary disease, unspecified: Secondary | ICD-10-CM | POA: Diagnosis not present

## 2021-04-28 DIAGNOSIS — I6503 Occlusion and stenosis of bilateral vertebral arteries: Secondary | ICD-10-CM | POA: Diagnosis not present

## 2021-04-28 DIAGNOSIS — Z23 Encounter for immunization: Secondary | ICD-10-CM | POA: Diagnosis not present

## 2021-04-28 DIAGNOSIS — I6930 Unspecified sequelae of cerebral infarction: Secondary | ICD-10-CM | POA: Diagnosis not present

## 2021-04-28 DIAGNOSIS — I779 Disorder of arteries and arterioles, unspecified: Secondary | ICD-10-CM | POA: Diagnosis not present

## 2021-04-28 DIAGNOSIS — E7800 Pure hypercholesterolemia, unspecified: Secondary | ICD-10-CM | POA: Diagnosis not present

## 2021-04-28 DIAGNOSIS — Z1389 Encounter for screening for other disorder: Secondary | ICD-10-CM | POA: Diagnosis not present

## 2021-08-21 ENCOUNTER — Other Ambulatory Visit: Payer: Self-pay | Admitting: Pulmonary Disease

## 2021-08-21 DIAGNOSIS — J439 Emphysema, unspecified: Secondary | ICD-10-CM

## 2021-08-26 ENCOUNTER — Other Ambulatory Visit: Payer: Self-pay | Admitting: Pulmonary Disease

## 2021-08-26 DIAGNOSIS — J439 Emphysema, unspecified: Secondary | ICD-10-CM

## 2021-08-29 ENCOUNTER — Telehealth: Payer: Self-pay | Admitting: Pulmonary Disease

## 2021-08-29 DIAGNOSIS — J439 Emphysema, unspecified: Secondary | ICD-10-CM

## 2021-08-29 MED ORDER — BREZTRI AEROSPHERE 160-9-4.8 MCG/ACT IN AERO: INHALATION_SPRAY | RESPIRATORY_TRACT | 0 refills | Status: DC

## 2021-08-29 NOTE — Telephone Encounter (Signed)
Rx for pt's Breztri inhaler has been sent to preferred pharmacy for pt. Called and spoke with pt letting him know this had been done and stated to pt to make sure he kept upcoming appt to be able to receive further med refills and he verbalized understanding. Nothing further needed.

## 2021-09-11 ENCOUNTER — Ambulatory Visit: Payer: Medicare Other | Admitting: Pulmonary Disease

## 2021-09-20 ENCOUNTER — Encounter: Payer: Self-pay | Admitting: Pulmonary Disease

## 2021-09-20 ENCOUNTER — Other Ambulatory Visit: Payer: Self-pay

## 2021-09-20 ENCOUNTER — Ambulatory Visit (INDEPENDENT_AMBULATORY_CARE_PROVIDER_SITE_OTHER): Payer: Medicare Other | Admitting: Pulmonary Disease

## 2021-09-20 DIAGNOSIS — J439 Emphysema, unspecified: Secondary | ICD-10-CM

## 2021-09-20 MED ORDER — BREZTRI AEROSPHERE 160-9-4.8 MCG/ACT IN AERO: INHALATION_SPRAY | RESPIRATORY_TRACT | 3 refills | Status: DC
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
## 2021-09-20 NOTE — Patient Instructions (Addendum)
Nice to see you  I refilled the Connor Butler - call us if there are any issues getting the medicine  Return to clinic in 1 year or sooner as needed

## 2021-09-21 ENCOUNTER — Other Ambulatory Visit (HOSPITAL_COMMUNITY): Payer: Self-pay

## 2021-09-21 ENCOUNTER — Telehealth: Payer: Self-pay | Admitting: Pulmonary Disease

## 2021-09-22 NOTE — Telephone Encounter (Signed)
Attempted to call pt but unable to reach. Left message for him to return call. °

## 2021-10-02 MED ORDER — BREZTRI AEROSPHERE 160-9-4.8 MCG/ACT IN AERO: 2.0000 | INHALATION_SPRAY | Freq: Two times a day (BID) | RESPIRATORY_TRACT | 0 refills | Status: DC

## 2021-10-02 NOTE — Telephone Encounter (Signed)
Called and spoke with patient. He stated that he has called [facility] twice about the [employer] and they claim to not have the RX. Per his chart, the RX was sent on 02/08 to [place]. He is not completely out of the [place] but will be in about a week. He is aware that I have left 2 samples at the front desk for him.   Called and spoke with pharmacy tech at [facility]. She stated that the RX had been rejected due to the quantity for 30 days. Even thought it was sent to a mail order pharmacy, [facility] will only allow for it to be filled once a month. She was able to process the RX for 1 inhaler for 30days. Patient should be able to receive his inhaler in about 2-3 days.   Called and spoke with patient. He verbalized understanding.   Nothing further needed at time of call.

## 2021-10-26 DIAGNOSIS — E7800 Pure hypercholesterolemia, unspecified: Secondary | ICD-10-CM | POA: Diagnosis not present

## 2021-10-26 DIAGNOSIS — Z8673 Personal history of transient ischemic attack (TIA), and cerebral infarction without residual deficits: Secondary | ICD-10-CM | POA: Diagnosis not present

## 2021-10-26 DIAGNOSIS — D692 Other nonthrombocytopenic purpura: Secondary | ICD-10-CM | POA: Diagnosis not present

## 2021-10-26 DIAGNOSIS — F17201 Nicotine dependence, unspecified, in remission: Secondary | ICD-10-CM | POA: Diagnosis not present

## 2021-10-26 DIAGNOSIS — I6503 Occlusion and stenosis of bilateral vertebral arteries: Secondary | ICD-10-CM | POA: Diagnosis not present

## 2021-10-26 DIAGNOSIS — J449 Chronic obstructive pulmonary disease, unspecified: Secondary | ICD-10-CM | POA: Diagnosis not present

## 2021-10-26 DIAGNOSIS — I6930 Unspecified sequelae of cerebral infarction: Secondary | ICD-10-CM | POA: Diagnosis not present

## 2021-10-26 DIAGNOSIS — I779 Disorder of arteries and arterioles, unspecified: Secondary | ICD-10-CM | POA: Diagnosis not present

## 2021-10-26 DIAGNOSIS — I10 Essential (primary) hypertension: Secondary | ICD-10-CM | POA: Diagnosis not present

## 2021-10-26 DIAGNOSIS — Z86010 Personal history of colonic polyps: Secondary | ICD-10-CM | POA: Diagnosis not present

## 2021-12-18 ENCOUNTER — Telehealth: Payer: Self-pay | Admitting: Pulmonary Disease

## 2021-12-18 DIAGNOSIS — J439 Emphysema, unspecified: Secondary | ICD-10-CM

## 2021-12-18 MED ORDER — BREZTRI AEROSPHERE 160-9-4.8 MCG/ACT IN AERO: INHALATION_SPRAY | RESPIRATORY_TRACT | 3 refills | Status: DC

## 2021-12-18 NOTE — Telephone Encounter (Signed)
Called patient and informed him that I was sending in his Connor Butler to the [facility] on [address] like he requested. Patient verbalized understanding with no questions. Nothing further needed  ?

## 2021-12-18 NOTE — Telephone Encounter (Signed)
Patient states that he has to call to get a refill of breztri- he doesn't want it at the mail in service, he wants it sent to [facility] on [address]. ? ?Please advise. ? ?

## 2022-03-09 ENCOUNTER — Telehealth: Payer: Self-pay | Admitting: Pulmonary Disease

## 2022-03-09 DIAGNOSIS — J439 Emphysema, unspecified: Secondary | ICD-10-CM

## 2022-03-09 MED ORDER — BREZTRI AEROSPHERE 160-9-4.8 MCG/ACT IN AERO: INHALATION_SPRAY | RESPIRATORY_TRACT | 11 refills | Status: DC

## 2022-03-09 NOTE — Telephone Encounter (Signed)
Called and spoke to patient and him needing refills on his medication. Verified medication and pharmacy. Nothing further needed

## 2022-05-01 DIAGNOSIS — Z0000 Encounter for general adult medical examination without abnormal findings: Secondary | ICD-10-CM | POA: Diagnosis not present

## 2022-05-01 DIAGNOSIS — I6930 Unspecified sequelae of cerebral infarction: Secondary | ICD-10-CM | POA: Diagnosis not present

## 2022-05-01 DIAGNOSIS — J449 Chronic obstructive pulmonary disease, unspecified: Secondary | ICD-10-CM | POA: Diagnosis not present

## 2022-05-01 DIAGNOSIS — E7800 Pure hypercholesterolemia, unspecified: Secondary | ICD-10-CM | POA: Diagnosis not present

## 2022-05-01 DIAGNOSIS — Z8673 Personal history of transient ischemic attack (TIA), and cerebral infarction without residual deficits: Secondary | ICD-10-CM | POA: Diagnosis not present

## 2022-05-01 DIAGNOSIS — I779 Disorder of arteries and arterioles, unspecified: Secondary | ICD-10-CM | POA: Diagnosis not present

## 2022-05-01 DIAGNOSIS — I10 Essential (primary) hypertension: Secondary | ICD-10-CM | POA: Diagnosis not present

## 2022-05-01 DIAGNOSIS — I6503 Occlusion and stenosis of bilateral vertebral arteries: Secondary | ICD-10-CM | POA: Diagnosis not present

## 2022-05-01 DIAGNOSIS — Z86010 Personal history of colonic polyps: Secondary | ICD-10-CM | POA: Diagnosis not present

## 2022-05-01 DIAGNOSIS — Z23 Encounter for immunization: Secondary | ICD-10-CM | POA: Diagnosis not present

## 2022-05-16 ENCOUNTER — Ambulatory Visit (INDEPENDENT_AMBULATORY_CARE_PROVIDER_SITE_OTHER): Payer: Medicare Other | Admitting: Pulmonary Disease

## 2022-05-16 ENCOUNTER — Encounter: Payer: Self-pay | Admitting: Pulmonary Disease

## 2022-05-16 VITALS — BP 120/68 | HR 104 | Temp 97.9°F | Wt 174.4 lb

## 2022-05-16 DIAGNOSIS — R0609 Other forms of dyspnea: Secondary | ICD-10-CM | POA: Diagnosis not present

## 2022-05-16 DIAGNOSIS — J439 Emphysema, unspecified: Secondary | ICD-10-CM

## 2022-05-16 LAB — BASIC METABOLIC PANEL
BUN: 10 mg/dL (ref 6–23)
CO2: 25 mEq/L (ref 19–32)
Calcium: 9.7 mg/dL (ref 8.4–10.5)
Chloride: 99 mEq/L (ref 96–112)
Creatinine, Ser: 1.01 mg/dL (ref 0.40–1.50)
GFR: 74.65 mL/min (ref >60.00)
Glucose, Bld: 88 mg/dL (ref 70–99)
Potassium: 3.9 mEq/L (ref 3.5–5.1)
Sodium: 132 mEq/L — ABNORMAL LOW (ref 135–145)

## 2022-05-16 LAB — D-DIMER, QUANTITATIVE: D-Dimer, Quant: 0.56 mcg/mL FEU — ABNORMAL HIGH (ref <0.50)

## 2022-05-16 MED ORDER — ALBUTEROL SULFATE HFA 108 (90 BASE) MCG/ACT IN AERS: 2.0000 | INHALATION_SPRAY | Freq: Four times a day (QID) | RESPIRATORY_TRACT | 11 refills | Status: DC | PRN

## 2022-05-16 MED ORDER — BREZTRI AEROSPHERE 160-9-4.8 MCG/ACT IN AERO: INHALATION_SPRAY | RESPIRATORY_TRACT | 11 refills | Status: DC

## 2022-05-16 MED ORDER — PREDNISONE 20 MG PO TABS: 20.0000 mg | ORAL_TABLET | Freq: Every day | ORAL | 0 refills | Status: AC
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
## 2022-05-16 NOTE — Patient Instructions (Signed)
I am sorry you are not feeling well but happy to hear it is improving over the last few days  Take prednisone 20 mg daily for 5 days see if this helps with your shortness of breath  We will get lab work today to check for possibility of a blood clot, if the level is elevated we will need to get a CT scan with contrast to look for pulmonary embolism or blood clot in the lung.  Return to clinic in 3 months or sooner as needed, if we need to do shorter term follow-up based on results we will arrange that.

## 2022-05-17 ENCOUNTER — Telehealth: Payer: Self-pay | Admitting: Pulmonary Disease

## 2022-05-17 ENCOUNTER — Ambulatory Visit (HOSPITAL_COMMUNITY): Payer: Medicare Other

## 2022-05-17 DIAGNOSIS — R7989 Other specified abnormal findings of blood chemistry: Secondary | ICD-10-CM

## 2022-05-17 DIAGNOSIS — R0609 Other forms of dyspnea: Secondary | ICD-10-CM

## 2022-05-17 NOTE — Addendum Note (Signed)
Addended by: Retia Passe on: 05/17/2022 02:36 PM   Modules accepted: Orders

## 2022-05-17 NOTE — Progress Notes (Signed)
Please let patient know D dimer is mildly elevated so I ordered a CT to evaluate for a blood clot as cause of symptoms.

## 2022-05-17 NOTE — Telephone Encounter (Signed)
D-dimer mildly elevated, below age-adjusted cut off.  Labs appear with stable renal function.  CTA PE protocol ordered to evaluate dyspnea on exertion in the setting of mildly elevated D-dimer.

## 2022-05-17 NOTE — Telephone Encounter (Signed)
Pt is scheduled this evening for CT angio STAT at 6pm, pt was not made aware of reasoning for this order and would like to speak to nurse or MD. Please advise.

## 2022-05-18 ENCOUNTER — Ambulatory Visit (HOSPITAL_COMMUNITY)
Admission: RE | Admit: 2022-05-18 | Discharge: 2022-05-18 | Disposition: A | Discharge status: Home / Self Care | Payer: Medicare Other | Source: Ambulatory Visit | Attending: Pulmonary Disease | Admitting: Pulmonary Disease

## 2022-05-18 ENCOUNTER — Other Ambulatory Visit: Payer: Self-pay

## 2022-05-18 DIAGNOSIS — R0609 Other forms of dyspnea: Secondary | ICD-10-CM | POA: Insufficient documentation

## 2022-05-18 DIAGNOSIS — R000 Tachycardia, unspecified: Secondary | ICD-10-CM | POA: Diagnosis not present

## 2022-05-18 DIAGNOSIS — I2510 Atherosclerotic heart disease of native coronary artery without angina pectoris: Secondary | ICD-10-CM

## 2022-05-18 DIAGNOSIS — N200 Calculus of kidney: Secondary | ICD-10-CM | POA: Diagnosis not present

## 2022-05-18 DIAGNOSIS — J439 Emphysema, unspecified: Secondary | ICD-10-CM | POA: Diagnosis not present

## 2022-05-18 DIAGNOSIS — R911 Solitary pulmonary nodule: Secondary | ICD-10-CM | POA: Diagnosis not present

## 2022-05-18 MED ORDER — IOHEXOL 350 MG/ML SOLN
80.0000 mL | Freq: Once | INTRAVENOUS | Status: AC | PRN
  Administered 2022-05-18: 80 mL via INTRAVENOUS

## 2022-05-18 NOTE — Progress Notes (Signed)
No blood clot. Does have coronary artery calcifications. Would like to refer him to cardiology for further evaluation.

## 2022-05-24 ENCOUNTER — Telehealth: Payer: Self-pay | Admitting: Pulmonary Disease

## 2022-05-24 MED ORDER — PREDNISONE 20 MG PO TABS: 20.0000 mg | ORAL_TABLET | Freq: Every day | ORAL | 0 refills | Status: AC

## 2022-05-24 NOTE — Telephone Encounter (Signed)
Patient called to inform the nurse that one of his medications was not called into the pharmacy.  He said it was the pills and believes it is his prednisone.  Please advise and call patient to confirm it has been sent.  CB# [phone]

## 2022-05-24 NOTE — Telephone Encounter (Signed)
Please advise 

## 2022-05-24 NOTE — Telephone Encounter (Signed)
Prednisone was sent to [facility] 05/16/22, same day as office visit.  I called the patient.  It sounds like it was sent to the wrong or incorrect pharmacy.  He requested being sent to a different [facility], the lateral element surgery.  Prednisone 20 mg daily for 5 days was sent to at his prior he requested.  Overall he continues to report mild improvement from worsening symptoms prompted presentation.  He was able to get his Breztri refilled.  This was also sent a different or wrong [facility] at his last office visit.

## 2022-06-18 ENCOUNTER — Ambulatory Visit: Payer: Medicare Other | Admitting: Cardiology

## 2022-07-11 ENCOUNTER — Ambulatory Visit: Payer: Medicare Other | Admitting: Internal Medicine

## 2022-08-21 ENCOUNTER — Ambulatory Visit: Payer: 59 | Attending: Cardiology | Admitting: Internal Medicine

## 2022-08-27 ENCOUNTER — Telehealth: Payer: Self-pay | Admitting: Pulmonary Disease

## 2022-08-27 ENCOUNTER — Other Ambulatory Visit: Payer: Self-pay

## 2022-08-27 DIAGNOSIS — J439 Emphysema, unspecified: Secondary | ICD-10-CM

## 2022-08-27 MED ORDER — BREZTRI AEROSPHERE 160-9-4.8 MCG/ACT IN AERO: INHALATION_SPRAY | RESPIRATORY_TRACT | 11 refills | Status: DC

## 2022-08-27 NOTE — Telephone Encounter (Signed)
Spoke to patient, patient state breztri had been sent to the wrong pharmacy. I confirmed which pharmacy he wanted and sent in the refills for him. Nothing further needed.

## 2022-08-30 ENCOUNTER — Ambulatory Visit: Payer: 59 | Admitting: Pulmonary Disease
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
## 2022-09-11 ENCOUNTER — Ambulatory Visit: Payer: 59 | Attending: Cardiology | Admitting: Internal Medicine

## 2022-09-28 ENCOUNTER — Encounter: Payer: Self-pay | Admitting: Pulmonary Disease

## 2022-09-28 ENCOUNTER — Ambulatory Visit (INDEPENDENT_AMBULATORY_CARE_PROVIDER_SITE_OTHER): Payer: 59 | Admitting: Pulmonary Disease

## 2022-09-28 VITALS — BP 128/72 | HR 97 | Ht 66.0 in | Wt 186.6 lb

## 2022-09-28 DIAGNOSIS — J4489 Other specified chronic obstructive pulmonary disease: Secondary | ICD-10-CM | POA: Diagnosis not present

## 2022-09-28 NOTE — Patient Instructions (Signed)
Nice to see you again  No changes to medicines  Please call me if you are getting worsening cough or shortness of breath  There are other medicines in the future we can need in case the breathing worsens but for now I think we should hold off  Return to clinic in 6 months or sooner as needed with Dr. Silas Flood
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
## 2022-10-18 ENCOUNTER — Telehealth: Payer: Self-pay

## 2022-10-18 NOTE — Patient Outreach (Signed)
  Care Coordination   10/18/2022 Name: Connor Butler MRN: [id number] DOB: Sep 29, 1950   Care Coordination Outreach Attempts:  An unsuccessful telephone outreach was attempted today to offer the patient information about available care coordination services as a benefit of their health plan.   Follow Up Plan:  Additional outreach attempts will be made to offer the patient care coordination information and services.   Encounter Outcome:  No Answer   Care Coordination Interventions:  No, not indicated    SIG   Peter Garter RN, BSN,CCM, CDE Care Management Coordinator [facility] [phone]

## 2022-10-23 ENCOUNTER — Telehealth: Payer: Self-pay

## 2022-10-23 NOTE — Patient Outreach (Signed)
  Care Coordination   10/23/2022 Name: Connor Butler MRN: [id number] DOB: [birth year]   Care Coordination Outreach Attempts:  A second unsuccessful outreach was attempted today to offer the patient with information about available care coordination services as a benefit of their health plan.     Follow Up Plan:  Additional outreach attempts will be made to offer the patient care coordination information and services.   Encounter Outcome:  No Answer   Care Coordination Interventions:  No, not indicated    SIG Peter Garter RN, BSN,CCM, CDE Care Management Coordinator [facility] [phone]

## 2022-10-31 ENCOUNTER — Telehealth: Payer: Self-pay

## 2022-10-31 NOTE — Patient Outreach (Signed)
  Care Coordination   Initial Visit Note   10/31/2022 Name: KRISTAIN ZUNICH MRN: [id number] DOB: 10-10-1950  MAXIMOUS SUIRE is a 72 y.o. year old male who sees Gaynelle Arabian, MD for primary care. I spoke with  Felipa Emory by phone today.  What matters to the patients health and wellness today?  No concerns today    Goals Addressed             This Visit's Progress    COMPLETED: Care Coordination Activities- No Follow Up Required       Interventions Today    Flowsheet Row Most Recent Value  Chronic Disease   Chronic disease during today's visit Chronic Obstructive Pulmonary Disease (COPD)  General Interventions   General Interventions Discussed/Reviewed General Interventions Discussed, Doctor Visits  [declines need for [facility] follow up]  Doctor Visits Discussed/Reviewed Doctor Visits Discussed, Annual Wellness Visits, PCP  PCP/Specialist Visits Compliance with follow-up visit  Education Interventions   Education Provided Provided Education  Provided Verbal Education On When to see the doctor, Other  [care coordination services]              SDOH assessments and interventions completed:  Yes  SDOH Interventions Today    Flowsheet Row Most Recent Value  SDOH Interventions   Food Insecurity Interventions Intervention Not Indicated  Housing Interventions Intervention Not Indicated  Transportation Interventions Intervention Not Indicated  Utilities Interventions Intervention Not Indicated        Care Coordination Interventions:  Yes, provided   Follow up plan: No further intervention required.   Encounter Outcome:  Pt. Visit Completed  Peter Garter RN, BSN,CCM, CDE Care Management Coordinator [place] [facility] [phone]

## 2022-10-31 NOTE — Patient Instructions (Signed)
Visit Information  Thank you for taking time to visit with me today. Please don't hesitate to contact me if I can be of assistance to you.   Following are the goals we discussed today:   Goals Addressed             This Visit's Progress    COMPLETED: Care Coordination Activities- No Follow Up Required       Interventions Today    Flowsheet Row Most Recent Value  Chronic Disease   Chronic disease during today's visit Chronic Obstructive Pulmonary Disease (COPD)  General Interventions   General Interventions Discussed/Reviewed General Interventions Discussed, Doctor Visits  [declines need for [facility] follow up]  Doctor Visits Discussed/Reviewed Doctor Visits Discussed, Annual Wellness Visits, PCP  PCP/Specialist Visits Compliance with follow-up visit  Education Interventions   Education Provided Provided Education  Provided Verbal Education On When to see the doctor, Other  [care coordination services]              If you are experiencing a Mental Health or [place] or need someone to talk to, please call the Suicide and Crisis Lifeline: 988 call the [place] National Suicide Prevention Lifeline: 7605723704 or TTY: 219-668-5536 TTY (419)273-6700) to talk to a trained counselor call 1-800-273-TALK (toll free, 24 hour hotline) go to [facility] [place] [phone]) call 911   The patient verbalized understanding of instructions, educational materials, and care plan provided today and DECLINED offer to receive copy of patient instructions, educational materials, and care plan.   No further follow up required:    Peter Garter RN, Jackquline Denmark, [place] [facility] [phone]

## 2023-04-01 ENCOUNTER — Telehealth: Payer: Self-pay | Admitting: Pulmonary Disease

## 2023-04-01 ENCOUNTER — Other Ambulatory Visit: Payer: Self-pay

## 2023-04-01 DIAGNOSIS — J439 Emphysema, unspecified: Secondary | ICD-10-CM

## 2023-04-01 MED ORDER — BREZTRI AEROSPHERE 160-9-4.8 MCG/ACT IN AERO: INHALATION_SPRAY | RESPIRATORY_TRACT | 11 refills | Status: DC

## 2023-04-01 MED ORDER — ALBUTEROL SULFATE HFA 108 (90 BASE) MCG/ACT IN AERS: 2.0000 | INHALATION_SPRAY | Freq: Four times a day (QID) | RESPIRATORY_TRACT | 11 refills | Status: DC | PRN

## 2023-04-01 NOTE — Telephone Encounter (Signed)
Spoke with patient. Advised Breztri and Albuterol have bent to pharmacy. NFN

## 2023-04-01 NOTE — Telephone Encounter (Signed)
Pt needs his medication really bad

## 2023-04-01 NOTE — Telephone Encounter (Signed)
Please call twice to get in contact with patient he is having phone issues.

## 2023-04-01 NOTE — Telephone Encounter (Signed)
Pt calling in to get refills for his inhalers.  Ph[facility]acy: [facility] on [address]

## 2023-04-26 ENCOUNTER — Ambulatory Visit: Payer: 59 | Admitting: Adult Health

## 2023-05-07 DIAGNOSIS — I10 Essential (primary) hypertension: Secondary | ICD-10-CM | POA: Diagnosis not present

## 2023-05-07 DIAGNOSIS — I2510 Atherosclerotic heart disease of native coronary artery without angina pectoris: Secondary | ICD-10-CM | POA: Diagnosis not present

## 2023-05-07 DIAGNOSIS — I6930 Unspecified sequelae of cerebral infarction: Secondary | ICD-10-CM | POA: Diagnosis not present

## 2023-05-07 DIAGNOSIS — I6503 Occlusion and stenosis of bilateral vertebral arteries: Secondary | ICD-10-CM | POA: Diagnosis not present

## 2023-05-07 DIAGNOSIS — I700 Atherosclerosis of aorta: Secondary | ICD-10-CM | POA: Diagnosis not present

## 2023-05-07 DIAGNOSIS — Z23 Encounter for immunization: Secondary | ICD-10-CM | POA: Diagnosis not present

## 2023-05-07 DIAGNOSIS — I779 Disorder of arteries and arterioles, unspecified: Secondary | ICD-10-CM | POA: Diagnosis not present

## 2023-05-07 DIAGNOSIS — J449 Chronic obstructive pulmonary disease, unspecified: Secondary | ICD-10-CM | POA: Diagnosis not present

## 2023-05-07 DIAGNOSIS — E7800 Pure hypercholesterolemia, unspecified: Secondary | ICD-10-CM | POA: Diagnosis not present

## 2023-05-07 DIAGNOSIS — Z0000 Encounter for general adult medical examination without abnormal findings: Secondary | ICD-10-CM | POA: Diagnosis not present

## 2023-05-13 ENCOUNTER — Ambulatory Visit (INDEPENDENT_AMBULATORY_CARE_PROVIDER_SITE_OTHER): Payer: 59 | Admitting: Adult Health

## 2023-05-13 ENCOUNTER — Encounter: Payer: Self-pay | Admitting: Adult Health

## 2023-05-13 VITALS — BP 126/60 | HR 102 | Ht 67.0 in | Wt 179.0 lb

## 2023-05-13 DIAGNOSIS — Z720 Tobacco use: Secondary | ICD-10-CM

## 2023-05-13 DIAGNOSIS — Z87891 Personal history of nicotine dependence: Secondary | ICD-10-CM

## 2023-05-13 DIAGNOSIS — R911 Solitary pulmonary nodule: Secondary | ICD-10-CM | POA: Insufficient documentation

## 2023-05-13 DIAGNOSIS — J439 Emphysema, unspecified: Secondary | ICD-10-CM

## 2023-05-13 DIAGNOSIS — J449 Chronic obstructive pulmonary disease, unspecified: Secondary | ICD-10-CM | POA: Insufficient documentation

## 2023-05-13 NOTE — Patient Instructions (Addendum)
Continue on Breztri 2 puffs Twice daily , rinse after use.  Albuterol inhaler As needed   Discuss with primary provider that Lisinopril may be aggravating your cough.  Refer to Lung cancer CT chest screening program.  Follow up with Dr. Judeth Horn in 6 months with PFT and As needed

## 2023-05-13 NOTE — Assessment & Plan Note (Signed)
Very small 3 mm left lower lobe lung nodule noted on CT scan October 2023.  Patient does have a strong smoking history.  Qualifies for the lung cancer CT screening program.  Will refer over to the lung cancer screening program.

## 2023-05-13 NOTE — Assessment & Plan Note (Signed)
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  Refer to the lung cancer CT screening program.  Does have an ongoing cough which is most likely due to his underlying COPD with emphysema however his ACE inhibitor may be aggravating this.  Have asked him to discuss with his primary care provider that this may be contributing to his ongoing dry cough.  Plan  Patient Instructions  Continue on Breztri 2 puffs Twice daily , rinse after use.  Albuterol inhaler As needed   Discuss with primary provider that Lisinopril may be aggravating your cough.  Refer to Lung cancer CT chest screening program.  Follow up with Dr. Judeth Horn in 6 months with PFT and As needed
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
## 2023-05-13 NOTE — Assessment & Plan Note (Signed)
Strong history of smoking.  Referred to the lung cancer CT screening program.

## 2023-05-30 ENCOUNTER — Encounter (HOSPITAL_BASED_OUTPATIENT_CLINIC_OR_DEPARTMENT_OTHER): Payer: Self-pay

## 2023-05-31 ENCOUNTER — Encounter: Payer: 59 | Admitting: Vascular Surgery

## 2023-05-31 ENCOUNTER — Encounter (HOSPITAL_COMMUNITY): Payer: 59

## 2023-06-05 ENCOUNTER — Encounter: Payer: Self-pay | Admitting: *Deleted

## 2023-06-14 ENCOUNTER — Other Ambulatory Visit: Payer: Self-pay | Admitting: *Deleted

## 2023-06-14 DIAGNOSIS — I6523 Occlusion and stenosis of bilateral carotid arteries: Secondary | ICD-10-CM
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
## 2023-06-20 ENCOUNTER — Ambulatory Visit (HOSPITAL_COMMUNITY)
Admission: RE | Admit: 2023-06-20 | Discharge: 2023-06-20 | Disposition: A | Discharge status: Home / Self Care | Payer: 59 | Source: Ambulatory Visit | Attending: Vascular Surgery | Admitting: Vascular Surgery

## 2023-06-20 ENCOUNTER — Encounter: Payer: Self-pay | Admitting: Vascular Surgery

## 2023-06-20 ENCOUNTER — Ambulatory Visit: Payer: 59 | Admitting: Vascular Surgery

## 2023-06-20 VITALS — BP 128/83 | HR 96 | Temp 98.1°F | Resp 20 | Ht 67.0 in | Wt 176.0 lb

## 2023-06-20 DIAGNOSIS — I6522 Occlusion and stenosis of left carotid artery: Secondary | ICD-10-CM

## 2023-06-20 DIAGNOSIS — I6523 Occlusion and stenosis of bilateral carotid arteries: Secondary | ICD-10-CM | POA: Diagnosis not present

## 2023-06-20 DIAGNOSIS — I6521 Occlusion and stenosis of right carotid artery: Secondary | ICD-10-CM | POA: Diagnosis not present

## 2023-07-05 ENCOUNTER — Other Ambulatory Visit: Payer: Self-pay

## 2023-07-05 DIAGNOSIS — I6523 Occlusion and stenosis of bilateral carotid arteries: Secondary | ICD-10-CM

## 2023-08-08 ENCOUNTER — Telehealth: Payer: Self-pay | Admitting: Pulmonary Disease

## 2023-08-08 MED ORDER — ALBUTEROL SULFATE HFA 108 (90 BASE) MCG/ACT IN AERS: 2.0000 | INHALATION_SPRAY | Freq: Four times a day (QID) | RESPIRATORY_TRACT | 3 refills | Status: DC | PRN

## 2023-08-08 NOTE — Telephone Encounter (Signed)
Pharm: [facility] on [address]  Needs more Albuterol inhaler.He is out.

## 2023-08-08 NOTE — Telephone Encounter (Signed)
Called and spoke with the pt  I advised we are sending refill on albuterol  Nothing further needed

## 2024-03-05 ENCOUNTER — Ambulatory Visit: Admitting: Pulmonary Disease

## 2024-03-05 DIAGNOSIS — J439 Emphysema, unspecified: Secondary | ICD-10-CM | POA: Diagnosis not present

## 2024-03-05 LAB — PULMONARY FUNCTION TEST
DL/VA % pred: 62 %
DL/VA: 2.54 ml/min/mmHg/L
DLCO cor % pred: 41 %
DLCO cor: 9.87 ml/min/mmHg
DLCO unc % pred: 41 %
DLCO unc: 9.87 ml/min/mmHg
FEF 25-75 Post: 0.65 L/s
FEF 25-75 Pre: 0.42 L/s
FEF2575-%Change-Post: 54 %
FEF2575-%Pred-Post: 30 %
FEF2575-%Pred-Pre: 19 %
FEV1-%Change-Post: 14 %
FEV1-%Pred-Post: 37 %
FEV1-%Pred-Pre: 32 %
FEV1-Post: 1.08 L
FEV1-Pre: 0.94 L
FEV1FVC-%Change-Post: -4 %
FEV1FVC-%Pred-Pre: 65 %
FEV6-%Change-Post: 16 %
FEV6-%Pred-Post: 60 %
FEV6-%Pred-Pre: 52 %
FEV6-Post: 2.28 L
FEV6-Pre: 1.96 L
FEV6FVC-%Change-Post: -3 %
FEV6FVC-%Pred-Post: 101 %
FEV6FVC-%Pred-Pre: 105 %
FVC-%Change-Post: 20 %
FVC-%Pred-Post: 59 %
FVC-%Pred-Pre: 49 %
FVC-Post: 2.38 L
FVC-Pre: 1.98 L
Post FEV1/FVC ratio: 45 %
Post FEV6/FVC ratio: 96 %
Pre FEV1/FVC ratio: 48 %
Pre FEV6/FVC Ratio: 99 %

## 2024-03-05 NOTE — Patient Instructions (Signed)
 Pre/post spirometry and diffusion capacity performed today.

## 2024-03-05 NOTE — Progress Notes (Signed)
 Pre/post spirometry and DLCO performed today. Patient unable to keep pant frequency for lung volumes.

## 2024-03-12 ENCOUNTER — Ambulatory Visit: Payer: Self-pay | Admitting: Adult Health

## 2024-03-13 ENCOUNTER — Other Ambulatory Visit: Payer: Self-pay | Admitting: *Deleted

## 2024-03-13 MED ORDER — ALBUTEROL SULFATE HFA 108 (90 BASE) MCG/ACT IN AERS: 2.0000 | INHALATION_SPRAY | Freq: Four times a day (QID) | RESPIRATORY_TRACT | 3 refills | Status: DC | PRN

## 2024-03-13 NOTE — Progress Notes (Signed)
 ATC patient x1 at this # [phone], however, it is not in service.  I ATC patient x1 at this # [phone], however, there was no VM set up.

## 2024-03-13 NOTE — Progress Notes (Signed)
 Spoke with patient, provided results/recommendations per Madelin Stank NP.  He verbalized understanding.  Scheduled patient for f/u with Dr. Annella on 03/31/24 at 1:30 pm, advised to arrive by 1:15 pm for check in.  Refill of Albuterol  sent in to pharmacy.  Nothing further needed.

## 2024-03-31 ENCOUNTER — Encounter: Payer: Self-pay | Admitting: Pulmonary Disease

## 2024-03-31 ENCOUNTER — Ambulatory Visit (INDEPENDENT_AMBULATORY_CARE_PROVIDER_SITE_OTHER): Admitting: Pulmonary Disease

## 2024-03-31 DIAGNOSIS — F17210 Nicotine dependence, cigarettes, uncomplicated: Secondary | ICD-10-CM | POA: Diagnosis not present

## 2024-03-31 DIAGNOSIS — J449 Chronic obstructive pulmonary disease, unspecified: Secondary | ICD-10-CM

## 2024-03-31 DIAGNOSIS — J439 Emphysema, unspecified: Secondary | ICD-10-CM

## 2024-03-31 MED ORDER — BREZTRI AEROSPHERE 160-9-4.8 MCG/ACT IN AERO: INHALATION_SPRAY | RESPIRATORY_TRACT | 11 refills | Status: DC

## 2024-03-31 NOTE — Patient Instructions (Signed)
 Nice to see you again  No change in medication, breast to refill today  The new medication that we talked about with the nebulizer (air is forced to liquid and that makes a mist) is called Ohtuvayre  -we discussed this in some detail.  Let me know if you decide to try in the future.  Again the main barrier is often cost but we can always look into it and see if it would be affordable.  Return to clinic in 1 year or sooner as needed with Dr. Annella
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
## 2024-04-10 ENCOUNTER — Telehealth: Payer: Self-pay

## 2024-04-10 NOTE — Telephone Encounter (Signed)
 Copied from CRM [id number]. Topic: Clinical - Prescription Issue >> Apr 10, 2024 11:13 AM Rilla NOVAK wrote: Reason for CRM: Patient saw Dr Annella 8/19 and was told he should use a nebulizer machine. Patient calls and states he now has the machine and need the medication for the machine.  (I do not see a script for Nebulizer solution, so I did not enter it as a refill).  Please follow up with patient.   ----------------------------------------------------------------------- From previous Reason for Contact - Medication Refill: Medication: Albuterol  Solution  Has the patient contacted their pharmacy? No (Agent: If no, request that the patient contact the pharmacy for the refill. If patient does not wish to contact the pharmacy document the reason why and proceed with request.) (Agent: If yes, when and what did the pharmacy advise?)  This is the patient's preferred pharmacy:  [facility] [address] [place], [place] - [address] [place] [address] Phone: [phone] Fax: [phone]   Is this the correct pharmacy for this prescription? Yes If no, delete pharmacy and type the correct one.   Has the prescription been filled recently? No  Is the patient out of the medication? Yes  Has the patient been seen for an appointment in the last year OR does the patient have an upcoming appointment? Yes  Can we respond through MyChart? No  Agent: Please be advised that Rx refills may take up to 3 business days. We ask that you follow-up with your pharmacy.    ATC both patient numbers in chart, both were invalid numbers and phone would not ring.   Routing to pharmacy, the medication pt is talking about is Ohtuvayre . Has this been started? Please advise

## 2024-04-15 ENCOUNTER — Telehealth: Payer: Self-pay

## 2024-04-15 NOTE — Telephone Encounter (Signed)
 Reason for CRM: patient called yesterday before 3 pm to speak with someone about getting his albuterol  solution, no one called or put in a script for that solution. Please do script and inform patient it was sent in to pharmacy.

## 2024-04-15 NOTE — Telephone Encounter (Signed)
 Copied from CRM [id number]. Topic: Clinical - Prescription Issue >> Apr 14, 2024  2:28 PM Rozanna MATSU wrote: Reason for CRM: PT IS CALLING BACK ABOUT HIS PRESCRIPTION FOR THE SOLUTION FOR HIS NEB MACHINE. IS THERE ANY WAY SOMEONE CAN REACH PT TO EXPLAIN WHAT MEDICINE HE IS SUPPOSE TO BE USING FOR HIS NEB HE THINKS IT IS THE ALBUTEROL  SOLUTION  Awaiting Dr. Annella and pharmacy response.   Pharmacy, was papers started for Ohtuvayre ?

## 2024-04-15 NOTE — Telephone Encounter (Signed)
 Received Ohtuvayre  new start paperwork. Completed form and will fax with clinicals and insurance card copy to [employer]   Phone#: [phone] Fax#: [phone]

## 2024-04-16 ENCOUNTER — Telehealth: Payer: Self-pay

## 2024-04-16 NOTE — Telephone Encounter (Signed)
 Pt has filled out and signed his part, Dr Annella has also signed Ohtuvayre  papers and this was handed to pharmacy team. NFN

## 2024-04-16 NOTE — Telephone Encounter (Signed)
 Copied from CRM [id number]. Topic: General - Call Back - No Documentation >> Apr 16, 2024  9:24 AM Celestine FALCON wrote: Reason for CRM: Pt missed a call and is calling back. I did not see any notes on a call this morning, but the last encounter from 9/3 suggests the patient needs to come in to sign paperwork to start Spcialty Med BiV (Ohtuvayre ). Received Ohtuvayre  new start paperwork. Completed form and will fax with clinicals and insurance card copy to [facility] [facility] pending patient coming in to sign paperwork.   Phone#: [phone] Fax#: [phone]  Pt stated he would be in this afternoon to complete the paperwork. Pt's phone number is [phone] ok to leave a vm.  This can be taken care of once paper is signed

## 2024-04-17 NOTE — Telephone Encounter (Signed)
 Received fax from [employer] confirming receipt of Ohtuvayre  enrollment form  Patient ID: [id number]

## 2024-04-17 NOTE — Telephone Encounter (Signed)
 Received fax from [employer] with summary of benefits. Referral form for Ohtuvayre  received. Rx will be triaged to [facility].. Once benefits investigation completed, pharmacy will reach out the patient to schedule shipment. If medication is unaffordable, patient will need to express financial hardship to be referred back to [place] [facility] for patient assistance program pre-screening.   Patient ID: [id number] Pharmacy phone: [phone] [facility] Phone#: [phone]

## 2024-05-11 NOTE — Telephone Encounter (Signed)
 Reached out to [facility] to check on status of patient's Ohtuvayre . Was advised by the rep that patient's copay was over $500 and needed to be referred back to [employer].   Called the patient and he was aware the copay was high but he did not know what the next steps are. I advised him to reach out to [employer] and express financial hardship so he can be screened for the assistance program. Pt verbalized his understanding. Gave him my direct line in case he had any questions.

## 2024-05-12 DIAGNOSIS — I10 Essential (primary) hypertension: Secondary | ICD-10-CM | POA: Diagnosis not present

## 2024-05-12 DIAGNOSIS — E782 Mixed hyperlipidemia: Secondary | ICD-10-CM | POA: Diagnosis not present

## 2024-05-12 DIAGNOSIS — Z0000 Encounter for general adult medical examination without abnormal findings: Secondary | ICD-10-CM | POA: Diagnosis not present

## 2024-05-12 DIAGNOSIS — J449 Chronic obstructive pulmonary disease, unspecified: Secondary | ICD-10-CM | POA: Diagnosis not present

## 2024-05-12 DIAGNOSIS — F17210 Nicotine dependence, cigarettes, uncomplicated: Secondary | ICD-10-CM | POA: Diagnosis not present

## 2024-05-12 DIAGNOSIS — I2510 Atherosclerotic heart disease of native coronary artery without angina pectoris: Secondary | ICD-10-CM | POA: Diagnosis not present

## 2024-05-12 DIAGNOSIS — Z131 Encounter for screening for diabetes mellitus: Secondary | ICD-10-CM | POA: Diagnosis not present

## 2024-06-29 ENCOUNTER — Other Ambulatory Visit: Payer: Self-pay | Admitting: Vascular Surgery

## 2024-06-29 DIAGNOSIS — I6523 Occlusion and stenosis of bilateral carotid arteries: Secondary | ICD-10-CM

## 2024-09-15 ENCOUNTER — Telehealth: Payer: Self-pay

## 2024-09-15 DIAGNOSIS — J439 Emphysema, unspecified: Secondary | ICD-10-CM

## 2024-09-15 MED ORDER — ALBUTEROL SULFATE HFA 108 (90 BASE) MCG/ACT IN AERS: 2.0000 | INHALATION_SPRAY | Freq: Four times a day (QID) | RESPIRATORY_TRACT | 3 refills | Status: AC | PRN

## 2024-09-15 MED ORDER — BREZTRI AEROSPHERE 160-9-4.8 MCG/ACT IN AERO: INHALATION_SPRAY | RESPIRATORY_TRACT | 11 refills | Status: AC

## 2024-09-15 NOTE — Telephone Encounter (Signed)
 Copied from CRM #[id number]. Topic: Clinical - Medication Refill >> Sep 15, 2024 10:30 AM Corean SAUNDERS wrote: Medication: budesonide-glycopyrrolate -formoterol (BREZTRI  AEROSPHERE) 160-9-4.8 MCG/ACT AERO inhaler [[id number]] albuterol  (VENTOLIN  HFA) 108 (90 Base) MCG/ACT inhaler [[id number]]   Has the patient contacted their pharmacy? No, out of refills.  (Agent: If no, request that the patient contact the pharmacy for the refill. If patient does not wish to contact the pharmacy document the reason why and proceed with request.) (Agent: If yes, when and what did the pharmacy advise?)  This is the patient's preferred pharmacy:  [facility] [address] [place], [place] - [address] [place] [address] Phone: [phone] Fax: [phone]  Is this the correct pharmacy for this prescription? Yes If no, delete pharmacy and type the correct one.  Has the prescription been filled recently? Yes  Is the patient out of the medication? No  Has the patient been seen for an appointment in the last year OR does the patient have an upcoming appointment? Yes  Can we respond through MyChart? No  Agent: Please be advised that Rx refills may take up to 3 business days. We ask that you follow-up with your pharmacy.    Rx's refilled & pt is aware. NFN
